# Patient Record
Sex: Female | Born: 1975 | Race: White | Hispanic: No | Marital: Married | Smoking: Never smoker
Health system: Southern US, Community
[De-identification: ages and names within clinical notes are randomized; demographics above are authoritative.]

## PROBLEM LIST (undated history)

## (undated) ENCOUNTER — Inpatient Hospital Stay (HOSPITAL_COMMUNITY): Payer: Self-pay

## (undated) DIAGNOSIS — R55 Syncope and collapse: Secondary | ICD-10-CM

## (undated) DIAGNOSIS — R51 Headache: Secondary | ICD-10-CM

## (undated) DIAGNOSIS — I499 Cardiac arrhythmia, unspecified: Secondary | ICD-10-CM

## (undated) DIAGNOSIS — R000 Tachycardia, unspecified: Secondary | ICD-10-CM

## (undated) DIAGNOSIS — D219 Benign neoplasm of connective and other soft tissue, unspecified: Secondary | ICD-10-CM

## (undated) DIAGNOSIS — K219 Gastro-esophageal reflux disease without esophagitis: Secondary | ICD-10-CM

## (undated) DIAGNOSIS — D649 Anemia, unspecified: Secondary | ICD-10-CM

## (undated) HISTORY — PX: BREAST SURGERY: SHX581

---

## 1999-07-14 ENCOUNTER — Other Ambulatory Visit: Admission: RE | Admit: 1999-07-14 | Discharge: 1999-07-14 | Payer: Self-pay | Admitting: Obstetrics and Gynecology

## 1999-11-10 ENCOUNTER — Encounter: Payer: Self-pay | Admitting: Obstetrics and Gynecology

## 1999-11-10 ENCOUNTER — Ambulatory Visit (HOSPITAL_COMMUNITY): Admission: RE | Admit: 1999-11-10 | Discharge: 1999-11-10 | Payer: Self-pay | Admitting: Obstetrics and Gynecology

## 2000-08-09 ENCOUNTER — Other Ambulatory Visit: Admission: RE | Admit: 2000-08-09 | Discharge: 2000-08-09 | Payer: Self-pay | Admitting: Obstetrics and Gynecology

## 2001-06-27 ENCOUNTER — Ambulatory Visit (HOSPITAL_COMMUNITY): Admission: RE | Admit: 2001-06-27 | Discharge: 2001-06-27 | Payer: Self-pay | Admitting: Internal Medicine

## 2001-11-06 ENCOUNTER — Other Ambulatory Visit: Admission: RE | Admit: 2001-11-06 | Discharge: 2001-11-06 | Payer: Self-pay | Admitting: Obstetrics and Gynecology

## 2003-02-05 ENCOUNTER — Other Ambulatory Visit: Admission: RE | Admit: 2003-02-05 | Discharge: 2003-02-05 | Payer: Self-pay | Admitting: Obstetrics and Gynecology

## 2003-03-29 ENCOUNTER — Encounter: Admission: RE | Admit: 2003-03-29 | Discharge: 2003-06-27 | Payer: Self-pay | Admitting: *Deleted

## 2004-06-02 ENCOUNTER — Other Ambulatory Visit: Admission: RE | Admit: 2004-06-02 | Discharge: 2004-06-02 | Payer: Self-pay | Admitting: Obstetrics and Gynecology

## 2005-06-25 HISTORY — PX: BREAST ENHANCEMENT SURGERY: SHX7

## 2005-09-14 ENCOUNTER — Other Ambulatory Visit: Admission: RE | Admit: 2005-09-14 | Discharge: 2005-09-14 | Payer: Self-pay | Admitting: Obstetrics and Gynecology

## 2007-04-24 ENCOUNTER — Ambulatory Visit: Payer: Self-pay | Admitting: Family Medicine

## 2010-11-21 ENCOUNTER — Inpatient Hospital Stay (HOSPITAL_COMMUNITY): Payer: BC Managed Care – PPO

## 2010-11-21 ENCOUNTER — Inpatient Hospital Stay (HOSPITAL_COMMUNITY)
Admission: AD | Admit: 2010-11-21 | Discharge: 2010-12-07 | DRG: 651 | Disposition: A | Discharge status: Home / Self Care | Payer: BC Managed Care – PPO | Source: Ambulatory Visit | Attending: Obstetrics and Gynecology | Admitting: Obstetrics and Gynecology

## 2010-11-21 DIAGNOSIS — D259 Leiomyoma of uterus, unspecified: Secondary | ICD-10-CM | POA: Diagnosis present

## 2010-11-21 DIAGNOSIS — Z22330 Carrier of Group B streptococcus: Secondary | ICD-10-CM

## 2010-11-21 DIAGNOSIS — O321XX0 Maternal care for breech presentation, not applicable or unspecified: Secondary | ICD-10-CM | POA: Diagnosis present

## 2010-11-21 DIAGNOSIS — O34599 Maternal care for other abnormalities of gravid uterus, unspecified trimester: Secondary | ICD-10-CM | POA: Diagnosis present

## 2010-11-21 DIAGNOSIS — D4959 Neoplasm of unspecified behavior of other genitourinary organ: Secondary | ICD-10-CM | POA: Diagnosis present

## 2010-11-21 DIAGNOSIS — O99892 Other specified diseases and conditions complicating childbirth: Secondary | ICD-10-CM | POA: Diagnosis present

## 2010-11-21 DIAGNOSIS — O42919 Preterm premature rupture of membranes, unspecified as to length of time between rupture and onset of labor, unspecified trimester: Secondary | ICD-10-CM

## 2010-11-21 DIAGNOSIS — O4290 Premature rupture of membranes, unspecified as to length of time between rupture and onset of labor, unspecified weeks of gestation: Principal | ICD-10-CM

## 2010-11-21 LAB — CBC
HCT: 34.1 % — ABNORMAL LOW (ref 36.0–46.0)
Hemoglobin: 11.5 g/dL — ABNORMAL LOW (ref 12.0–15.0)
MCH: 31.2 pg (ref 26.0–34.0)
MCHC: 33.7 g/dL (ref 30.0–36.0)
MCV: 92.4 fL (ref 78.0–100.0)
Platelets: 260 10*3/uL (ref 150–400)
RBC: 3.69 MIL/uL — ABNORMAL LOW (ref 3.87–5.11)
RDW: 12.7 % (ref 11.5–15.5)
WBC: 12.7 10*3/uL — ABNORMAL HIGH (ref 4.0–10.5)

## 2010-11-21 LAB — COMPREHENSIVE METABOLIC PANEL
ALT: 16 U/L (ref 0–35)
Albumin: 2.5 g/dL — ABNORMAL LOW (ref 3.5–5.2)
Alkaline Phosphatase: 100 U/L (ref 39–117)
Calcium: 9 mg/dL (ref 8.4–10.5)
Potassium: 4.1 mEq/L (ref 3.5–5.1)
Sodium: 137 mEq/L (ref 135–145)
Total Protein: 5.9 g/dL — ABNORMAL LOW (ref 6.0–8.3)

## 2010-11-22 LAB — RH IMMUNE GLOBULIN WORKUP (NOT WOMEN'S HOSP)
ABO/RH(D): A NEG
Antibody Screen: NEGATIVE
Unit division: 0

## 2010-11-23 LAB — STREP B DNA PROBE

## 2010-11-23 LAB — GENTAMICIN LEVEL, PEAK: Gentamicin Pk: 3.4 ug/mL — ABNORMAL LOW (ref 5.0–10.0)

## 2010-11-24 LAB — CBC
HCT: 31 % — ABNORMAL LOW (ref 36.0–46.0)
Hemoglobin: 10.5 g/dL — ABNORMAL LOW (ref 12.0–15.0)
MCHC: 33.9 g/dL (ref 30.0–36.0)
RDW: 13.2 % (ref 11.5–15.5)
WBC: 13.5 10*3/uL — ABNORMAL HIGH (ref 4.0–10.5)

## 2010-11-27 ENCOUNTER — Inpatient Hospital Stay (HOSPITAL_COMMUNITY): Payer: BC Managed Care – PPO

## 2010-11-27 LAB — CBC
Hemoglobin: 12 g/dL (ref 12.0–15.0)
MCH: 31.9 pg (ref 26.0–34.0)
Platelets: 287 10*3/uL (ref 150–400)
RBC: 3.76 MIL/uL — ABNORMAL LOW (ref 3.87–5.11)
WBC: 16.5 10*3/uL — ABNORMAL HIGH (ref 4.0–10.5)

## 2010-11-28 ENCOUNTER — Other Ambulatory Visit (HOSPITAL_COMMUNITY): Payer: BC Managed Care – PPO

## 2010-11-28 ENCOUNTER — Inpatient Hospital Stay (HOSPITAL_COMMUNITY): Payer: BC Managed Care – PPO

## 2010-11-28 LAB — URINE CULTURE

## 2010-11-29 LAB — CULTURE, BETA STREP (GROUP B ONLY)

## 2010-11-30 ENCOUNTER — Inpatient Hospital Stay (HOSPITAL_COMMUNITY): Payer: BC Managed Care – PPO

## 2010-11-30 LAB — CBC
HCT: 33.7 % — ABNORMAL LOW (ref 36.0–46.0)
Hemoglobin: 11.2 g/dL — ABNORMAL LOW (ref 12.0–15.0)
MCH: 31.4 pg (ref 26.0–34.0)
MCV: 94.4 fL (ref 78.0–100.0)
Platelets: 288 10*3/uL (ref 150–400)
RBC: 3.57 MIL/uL — ABNORMAL LOW (ref 3.87–5.11)
WBC: 14.8 10*3/uL — ABNORMAL HIGH (ref 4.0–10.5)

## 2010-12-01 LAB — CBC
HCT: 35.3 % — ABNORMAL LOW (ref 36.0–46.0)
MCH: 31.6 pg (ref 26.0–34.0)
MCV: 93.9 fL (ref 78.0–100.0)
RDW: 13.1 % (ref 11.5–15.5)
WBC: 16.3 10*3/uL — ABNORMAL HIGH (ref 4.0–10.5)

## 2010-12-01 LAB — DIFFERENTIAL
Eosinophils Absolute: 0.1 10*3/uL (ref 0.0–0.7)
Eosinophils Relative: 1 % (ref 0–5)
Lymphocytes Relative: 9 % — ABNORMAL LOW (ref 12–46)
Lymphs Abs: 1.5 10*3/uL (ref 0.7–4.0)
Monocytes Absolute: 1.1 10*3/uL — ABNORMAL HIGH (ref 0.1–1.0)
Monocytes Relative: 7 % (ref 3–12)

## 2010-12-03 ENCOUNTER — Other Ambulatory Visit: Payer: Self-pay | Admitting: Obstetrics and Gynecology

## 2010-12-03 LAB — CBC
Hemoglobin: 11 g/dL — ABNORMAL LOW (ref 12.0–15.0)
MCH: 32.2 pg (ref 26.0–34.0)
MCHC: 34.5 g/dL (ref 30.0–36.0)
RDW: 13.1 % (ref 11.5–15.5)

## 2010-12-04 ENCOUNTER — Other Ambulatory Visit (HOSPITAL_COMMUNITY): Payer: BC Managed Care – PPO

## 2010-12-04 LAB — CBC
MCH: 31.6 pg (ref 26.0–34.0)
MCV: 93.7 fL (ref 78.0–100.0)
Platelets: 225 10*3/uL (ref 150–400)
RDW: 12.8 % (ref 11.5–15.5)
WBC: 15.1 10*3/uL — ABNORMAL HIGH (ref 4.0–10.5)

## 2010-12-06 LAB — CULTURE, BETA STREP (GROUP B ONLY)

## 2010-12-07 ENCOUNTER — Encounter (HOSPITAL_COMMUNITY)
Admission: RE | Admit: 2010-12-07 | Discharge: 2010-12-07 | Disposition: A | Discharge status: Home / Self Care | Payer: BC Managed Care – PPO | Source: Ambulatory Visit | Attending: Obstetrics and Gynecology | Admitting: Obstetrics and Gynecology

## 2010-12-07 ENCOUNTER — Other Ambulatory Visit (HOSPITAL_COMMUNITY): Payer: BC Managed Care – PPO

## 2010-12-07 DIAGNOSIS — O923 Agalactia: Secondary | ICD-10-CM | POA: Insufficient documentation

## 2010-12-07 LAB — RH IMMUNE GLOB WKUP(>/=20WKS)(NOT WOMEN'S HOSP): Fetal Screen: NEGATIVE

## 2010-12-22 NOTE — Op Note (Signed)
[name], [name]               ACCOUNT NO.:  [id number]  MEDICAL RECORD NO.:  [id number]  LOCATION:  9131                          FACILITY:  [facility]  PHYSICIAN:  Guy Sandifer. Henderson Cloud, M.D. DATE OF BIRTH:  [birth year]  DATE OF PROCEDURE:  12/03/2010 DATE OF DISCHARGE:                              OPERATIVE REPORT   PREOPERATIVE DIAGNOSES: 1. Intrauterine pregnancy at 29-5/7 weeks estimated gestational age. 2. Breech. 3. Premature rupture of membranes. 4. Labor.  POSTOPERATIVE DIAGNOSES: 1. Intrauterine pregnancy at 29-5/7 weeks estimated gestational age. 2. Breech. 3. Premature rupture of membranes. 4. Labor. 5. Uterine leiomyomata.  PROCEDURE:  Emergent cesarean section with an inverted T-incision on the uterus and uterine myomectomy.  SURGEON:  Guy Sandifer. Henderson Cloud, MD  ANESTHESIA:  Spinal, Dana Allan, MD  SPECIMENS: 1. Placenta. 2. Uterine leiomyomata to pathology.  FINDINGS:  Viable female infant, Apgars of 5 and seven at 1 and five minutes respectively.  Birth weight 3 pounds 3 ounces.  Arterial cord pH insufficient.  SPECIMEN:  Estimated blood loss 1000 mL.  INDICATIONS AND CONSENT:  This patient is a 35 year old married white female G1 P0 who is admitted on Nov 21, 2010, with PROM.  The baby is in the breech position.  She has uterine leiomyomata.  She receives IV antibiotics and steroids.  She continues to leak fluid throughout the course of her stay.  Magnesium sulfate is started and discontinued at 1.4 neuroprophylaxis.  She remains afebrile with a nontender uterus. She begins contracting again in the early a.m. of December 01, 2010.  She started back on magnesium sulfate.  She continues on intravenous clindamycin and oral Zithromax.  This evening, she was having 3-4 contractions an hour.  However, she complains of rectal pressure with the contractions.  She has some pink staining with her fluid as well. Examination reveals the breech to be in the upper part of the  vagina. The emergent cesarean section was recommended.  Potential risks and complications were discussed with the patient and her husband including, but not limited to infection, organ damage, bleeding requiring transfusion of blood products with HIV and hepatitis acquisition, DVT, PE and pneumonia.  All questions were answered.  PROCEDURE:  The patient was taken to the operating room where she was identified as a spinal anesthetic placed.  She was placed in the dorsal supine position with a 15 degrees left lateral wedge.  Time-out was undertaken.  She was prepped abdominally.  Foley catheter was placed in the bladder to drain and she was draped in the sterile fashion.  After testing for adequate spinal anesthesia, skin was entered through a Pfannenstiel incision and dissection was carried out in layers to the peritoneum.  Peritoneum was incised and extended superiorly and inferiorly.  There are multiple leiomyomata ranging from 1-3-4 cm across the anterior uterine fundus extending down toward the lower uterine segment.  Vesicouterine peritoneum was taken down cephalolaterally.  The bladder flap was developed and the bladder blade was placed.  The uterus was incised in a low transverse manner, that was clear of the leiomyomata.  The uterus was entered bluntly with a hemostat and the uterine incision was extended cephalolaterally.  The baby is  in the frank breech position with the feet in the incision.  With the nurse's hand per vagina to elevate the breech, it allows me to elevate the breech adequately to deliver the legs in the breech through the incision.  The arms were then delivered as the baby's body was controlled.  The uterine fundus clamps down on the baby's head. Therefore, the incision was ted in the center of the lower uterine segment with bandage scissors.  My fingers were kept between the scissors and the baby's face.  This was done basically through and around uterine  leiomyomata.  This allows easy delivery of the baby's head.  Cord was clamped and cut and the baby was handed to the waiting pediatrics team.  Placenta was manually delivered and sent to pathology. Uterus was delivered through the incision for better exposure.  The vertical portion of the incision and the lower uterine segment extending up to the bottom of the anterior wall of the uterus was closed in 3 running locking layers of 0 Monocryl suture which achieves good hemostasis.  The low transverse incision had extended completely through the sides of the lower uterine segment.  With the uterus delivered through the incision, this allowed the operator's hand to be placed behind the lower uterine segment to protect the viscera while the incision was closed with 2 running imbricating layers of 0 Monocryl suture.  Two additional figure-of-eights were required on the left apex of the incision to obtain complete hemostasis.  Irrigation was carried out.  Inspection reveals the tubes and ovaries to be normal bilaterally. There are multiple larger leiomyomata about 4-cm in size at the uterine fundus some of which are pedunculated or subserosal.  It should also be noted that while closing the vertical part of the ted incision leiomyomata ranging from 1-2 cm were removed to allow proper closure. The uterus was then returned to the abdomen.  Irrigation reveals minor bleeding at the left apex which was controlled with an additional figure- of-eight.  Again, close inspection reveals no damage to surrounding viscera with this suture.  The anterior peritoneum was then closed in running fashion with 0 Monocryl suture which was also used to reapproximate the pyramidalis muscle in the midline.  Anterior rectus fascia was closed in running fashion with a 0 looped PDS suture and the skin was closed with clips.  All sponge, instrument and needle counts were correct and the patient was transferred to the  recovery room in stable condition.     Guy Sandifer Henderson Cloud, M.D.     JET/MEDQ  D:  12/03/2010  T:  12/04/2010  Job:  [id number]  Electronically Signed by Harold Hedge M.D. on 12/22/2010 09:15:31 AM

## 2010-12-22 NOTE — Discharge Summary (Signed)
[name], [name]               ACCOUNT NO.:  [id number]  MEDICAL RECORD NO.:  [id number]  LOCATION:  9131                          FACILITY:  [facility]  PHYSICIAN:  Guy Sandifer. Henderson Cloud, M.D. DATE OF BIRTH:  1976/05/09  DATE OF ADMISSION:  11/17/2010 DATE OF DISCHARGE:  12/07/2010                              DISCHARGE SUMMARY   ADMITTING DIAGNOSES: 1. Intrauterine pregnancy at 28-1/7th weeks' estimated gestational     age. 2. Preterm premature rupture of membranes.  DISCHARGE DIAGNOSES: 1. Status post low transverse cesarean section. 2. Viable female infant. 3. Myomectomy PROCEDURES: 1. Emergency cesarean section with an inverted-T incision  2. Uterine myomectomy.   REASON FOR ADMISSION:  Please see written H and P.  HOSPITAL COURSE:  The patient is a 35 year old primigravida who was admitted to [facility] at 28-1/7th weeks' estimated gestational age with preterm premature rupture of membranes.  On admission, vital signs were stable.  Fetal heart tones were reassuring. Clear fluid was noted.  The patient did have known uterine fibroids. The patient was started on steroids, clindamycin, and gentamycin. Ultrasound was performed for estimated fetal weight, presentation, and cervical length.  NICU consult was made.  The patient was started on magnesium sulfate.  Ultrasound was done which revealed biophysical profile of 4/8, two off for fetal movement and two off for fluid.  Baby was known to be in the breech presentation.  Group B beta strep culture was performed.  The patient continued to have small amount of leakage of amniotic fluid. Vital signs remained stable.  She was afebrile.  Fetus was reactive. Gentamyacin was discontinued.  The patient was started on erythromycin. Over the course of the next several days, the patient did develop a rash that was thought to be related to the azithromycin.  On December 03, 2010, the patient did complain of some rectal pressure  associated with the contractions.  Fetal heart tones were reactive and contractions were noted to be 3-4 per hour.  Baby was in the breech presentation, noted to be in the upper vagina.  Decision was made to proceed with an emergent cesarean delivery.  The patient was then taken to the operating room where spinal anesthesia was administered without difficulty.  A low transverse incision was made with an inverted-T of a viable female infant with Apgars of 5 at 1 minute and 7 at 5 minutes.  Birth weight was 3 pounds and 3 ounces.  The patient was then taken to the recovery room in stable condition.  On postoperative day #1, the patient did report the rash was worsening.  Baby was stable in the NICU on oxygen. Vital signs were stable.  Temp was 99.5.  Abdomen soft.  Erythematous rash was noted on the trunk, neck, and extremities.  Fundus was firm and nontender.  Abdominal dressing was noted to be clean, dry, and intact. Foley was drained with concentrated urine.  Laboratory findings revealed hemoglobin of 10.0, platelet count of 225,000, blood type was noted to be A negative.  The patient was started on IV steroids  and administered Benadryl p.o.  On postoperative day #2, the patient was without complaint.  Vital signs stable.  Fundus  firm and nontender. Incision was clean, dry, and intact.  On postoperative day #3, baby continued in the NICU on nasal O2 and feeding tube.  Itching was improved and she continued with a rash.  Vital signs stable.  Abdomen soft.  Fundus firm and nontender.  On postoperative day #4, the patient was without complaint.  Baby was in the NICU, now off the oxygen and continued on NG feeding tube.  Vital signs were stable.  Incision was clean, dry, and intact.  Staples were removed.  The patient later discharged home.  CONDITION ON DISCHARGE:  Stable.  DIET:  Regular as tolerated.  ACTIVITY:  No heavy lifting, no driving x2 weeks, no vaginal entry.  FOLLOWUP:   The patient is to follow up in the office in 1-2 weeks for an incision check.  She is to call for temperature greater than 100 degrees, persistent nausea, vomiting, heavy vaginal bleeding and/or redness or drainage from incisional site.  DISCHARGE MEDICATIONS: 1. Percocet 5/325, #30, 1 p.o. every 4-6 hours p.r.n. 2. Motrin 600 mg every 6 hours. 3. Prenatal vitamins 1 p.o. daily.     Julio Sicks, N.P.   ______________________________ Guy Sandifer. Henderson Cloud, M.D.    CC/MEDQ  D:  12/07/2010  T:  12/07/2010  Job:  [id number]  Electronically Signed by Julio Sicks N.P. on 12/11/2010 08:24:45 AM Electronically Signed by Harold Hedge M.D. on 12/22/2010 09:16:23 AM

## 2010-12-24 ENCOUNTER — Inpatient Hospital Stay (HOSPITAL_COMMUNITY): Admission: AD | Admit: 2010-12-24 | Payer: Self-pay | Source: Ambulatory Visit | Admitting: Obstetrics and Gynecology

## 2010-12-24 ENCOUNTER — Inpatient Hospital Stay: Admission: AD | Admit: 2010-12-24 | Payer: Self-pay | Source: Ambulatory Visit | Admitting: Obstetrics and Gynecology

## 2011-01-07 ENCOUNTER — Encounter (HOSPITAL_COMMUNITY)
Admission: RE | Admit: 2011-01-07 | Discharge: 2011-01-07 | Disposition: A | Discharge status: Home / Self Care | Payer: BC Managed Care – PPO | Source: Ambulatory Visit | Attending: Obstetrics and Gynecology | Admitting: Obstetrics and Gynecology

## 2011-01-07 DIAGNOSIS — O923 Agalactia: Secondary | ICD-10-CM | POA: Insufficient documentation

## 2011-02-07 ENCOUNTER — Encounter (HOSPITAL_COMMUNITY)
Admission: RE | Admit: 2011-02-07 | Discharge: 2011-02-07 | Disposition: A | Discharge status: Home / Self Care | Payer: BC Managed Care – PPO | Source: Ambulatory Visit | Attending: Obstetrics and Gynecology | Admitting: Obstetrics and Gynecology

## 2011-02-07 DIAGNOSIS — O923 Agalactia: Secondary | ICD-10-CM | POA: Insufficient documentation

## 2011-03-10 ENCOUNTER — Encounter (HOSPITAL_COMMUNITY)
Admission: RE | Admit: 2011-03-10 | Discharge: 2011-03-10 | Disposition: A | Discharge status: Home / Self Care | Payer: BC Managed Care – PPO | Source: Ambulatory Visit | Attending: Obstetrics and Gynecology | Admitting: Obstetrics and Gynecology

## 2011-03-10 DIAGNOSIS — O923 Agalactia: Secondary | ICD-10-CM | POA: Insufficient documentation

## 2011-04-10 ENCOUNTER — Encounter (HOSPITAL_COMMUNITY)
Admission: RE | Admit: 2011-04-10 | Discharge: 2011-04-10 | Disposition: A | Discharge status: Home / Self Care | Payer: BC Managed Care – PPO | Source: Ambulatory Visit | Attending: Obstetrics and Gynecology | Admitting: Obstetrics and Gynecology

## 2011-04-10 DIAGNOSIS — O923 Agalactia: Secondary | ICD-10-CM | POA: Insufficient documentation

## 2011-05-04 ENCOUNTER — Other Ambulatory Visit: Payer: Self-pay | Admitting: Dermatology

## 2011-05-11 ENCOUNTER — Encounter (HOSPITAL_COMMUNITY)
Admission: RE | Admit: 2011-05-11 | Discharge: 2011-05-11 | Disposition: A | Discharge status: Home / Self Care | Payer: BC Managed Care – PPO | Source: Ambulatory Visit | Attending: Obstetrics and Gynecology | Admitting: Obstetrics and Gynecology

## 2011-05-11 DIAGNOSIS — O923 Agalactia: Secondary | ICD-10-CM | POA: Insufficient documentation

## 2011-07-23 ENCOUNTER — Other Ambulatory Visit (HOSPITAL_COMMUNITY): Payer: BC Managed Care – PPO

## 2011-08-27 ENCOUNTER — Encounter (HOSPITAL_COMMUNITY): Payer: Self-pay | Admitting: Pharmacist

## 2011-09-10 ENCOUNTER — Encounter (HOSPITAL_COMMUNITY): Payer: Self-pay

## 2011-09-10 ENCOUNTER — Encounter (HOSPITAL_COMMUNITY)
Admission: RE | Admit: 2011-09-10 | Discharge: 2011-09-10 | Disposition: A | Discharge status: Home / Self Care | Payer: BC Managed Care – PPO | Source: Ambulatory Visit | Attending: Obstetrics and Gynecology | Admitting: Obstetrics and Gynecology

## 2011-09-10 HISTORY — DX: Syncope and collapse: R55

## 2011-09-10 HISTORY — DX: Gastro-esophageal reflux disease without esophagitis: K21.9

## 2011-09-10 HISTORY — DX: Headache: R51

## 2011-09-10 LAB — CBC
HCT: 41.6 % (ref 36.0–46.0)
Hemoglobin: 13.9 g/dL (ref 12.0–15.0)
MCH: 30.9 pg (ref 26.0–34.0)
MCHC: 33.4 g/dL (ref 30.0–36.0)
RBC: 4.5 MIL/uL (ref 3.87–5.11)

## 2011-09-10 LAB — SURGICAL PCR SCREEN
MRSA, PCR: NEGATIVE
Staphylococcus aureus: NEGATIVE

## 2011-09-10 NOTE — Patient Instructions (Addendum)
   Your procedure is scheduled on: Thursday March 21st  Enter through the Main Entrance of [facility] at: [employer] up the phone at the desk and dial [phone] and inform us of your arrival.  Please call this number if you have any problems the morning of surgery: [phone]  Remember: Do not eat food after midnight: Wednesday Do not drink clear liquids after: midnight Wednesday Take these medicines the morning of surgery with a SIP OF WATER: zantac Do not wear jewelry, make-up, or FINGER nail polish Do not wear lotions, powders, perfumes or deodorant. Do not shave 48 hours prior to surgery. Do not bring valuables to the hospital.  Leave suitcase in the car. After Surgery it may be brought to your room. For patients being admitted to the hospital, checkout time is 11:00am the day of discharge.  Patients discharged on the day of surgery will not be allowed to drive home.     Remember to use your hibiclens as instructed.Please shower with 1/2 bottle the evening before your surgery and the other 1/2 bottle the morning of surgery. Neck down avoiding private area.

## 2011-09-11 NOTE — H&P (Signed)
36 year old G 1 P 1 with symptomatic fibroids. She had PPROM and early delivery and the PPROM was thought to be secondary to the fibroids. She wants the fibroids removed before she conceives again. She does complain of a lot of lower abdominal cramping.  Ultrasound in October, 2012 revealed multiple intramural fibroids - largest measured 3.1 cm in diameter. She presents for abdominal myomectomies  Med History  Headaches  Surgical history  Cesarean Section Breast Agumentation  Medications None  Allergies PCN KEFLEX BACTRIM Azithromax  ROS Negative Social history  No tobacco no ETOH  Family History  Headaches Heart disease  Afebrile Vital Signs stable General alert and oriented Lung CTAB Car RRR Abdomen  Previous incision inspected - there is an indentation above the scar  Pelvic Uterus 10 - 12 weeks fibroids  IMPRESSION: Symptomatic Fibroids  PLAN: Abdominal Myomectomies Risks are reviewed with the patient

## 2011-09-13 ENCOUNTER — Inpatient Hospital Stay (HOSPITAL_COMMUNITY): Payer: BC Managed Care – PPO | Admitting: Anesthesiology

## 2011-09-13 ENCOUNTER — Encounter (HOSPITAL_COMMUNITY)
Admission: RE | Disposition: A | Discharge status: Home / Self Care | Payer: Self-pay | Source: Ambulatory Visit | Attending: Obstetrics and Gynecology

## 2011-09-13 ENCOUNTER — Inpatient Hospital Stay (HOSPITAL_COMMUNITY)
Admission: RE | Admit: 2011-09-13 | Discharge: 2011-09-15 | DRG: 359 | Disposition: A | Discharge status: Home / Self Care | Payer: BC Managed Care – PPO | Source: Ambulatory Visit | Attending: Obstetrics and Gynecology | Admitting: Obstetrics and Gynecology

## 2011-09-13 ENCOUNTER — Encounter (HOSPITAL_COMMUNITY): Payer: Self-pay | Admitting: Anesthesiology

## 2011-09-13 ENCOUNTER — Encounter (HOSPITAL_COMMUNITY): Payer: Self-pay | Admitting: *Deleted

## 2011-09-13 DIAGNOSIS — D251 Intramural leiomyoma of uterus: Principal | ICD-10-CM | POA: Diagnosis present

## 2011-09-13 DIAGNOSIS — Z01818 Encounter for other preprocedural examination: Secondary | ICD-10-CM

## 2011-09-13 DIAGNOSIS — D219 Benign neoplasm of connective and other soft tissue, unspecified: Secondary | ICD-10-CM

## 2011-09-13 DIAGNOSIS — Z01812 Encounter for preprocedural laboratory examination: Secondary | ICD-10-CM

## 2011-09-13 HISTORY — PX: MYOMECTOMY: SHX85

## 2011-09-13 LAB — CBC
Hemoglobin: 10.1 g/dL — ABNORMAL LOW (ref 12.0–15.0)
Platelets: 189 10*3/uL (ref 150–400)
RBC: 3.25 MIL/uL — ABNORMAL LOW (ref 3.87–5.11)

## 2011-09-13 SURGERY — MYOMECTOMY, ABDOMINAL APPROACH
Anesthesia: General | Wound class: Clean Contaminated

## 2011-09-13 MED ORDER — LACTATED RINGERS IV SOLN
INTRAVENOUS | Status: DC
  Administered 2011-09-13 (×3): via INTRAVENOUS

## 2011-09-13 MED ORDER — PROPOFOL 10 MG/ML IV EMUL
INTRAVENOUS | Status: DC | PRN
  Administered 2011-09-13: 30 ug/kg/min via INTRAVENOUS

## 2011-09-13 MED ORDER — FENTANYL CITRATE 0.05 MG/ML IJ SOLN
INTRAMUSCULAR | Status: AC
  Filled 2011-09-13: qty 2

## 2011-09-13 MED ORDER — DIPHENHYDRAMINE HCL 50 MG/ML IJ SOLN: 25.0000 mg | INTRAMUSCULAR | Status: DC | PRN

## 2011-09-13 MED ORDER — METOCLOPRAMIDE HCL 5 MG/ML IJ SOLN: 10.0000 mg | Freq: Three times a day (TID) | INTRAMUSCULAR | Status: DC | PRN

## 2011-09-13 MED ORDER — MENTHOL 3 MG MT LOZG: 1.0000 | LOZENGE | OROMUCOSAL | Status: DC | PRN

## 2011-09-13 MED ORDER — FENTANYL CITRATE 0.05 MG/ML IJ SOLN
INTRAMUSCULAR | Status: AC
  Filled 2011-09-13: qty 5

## 2011-09-13 MED ORDER — MORPHINE SULFATE 0.5 MG/ML IJ SOLN
INTRAMUSCULAR | Status: AC
  Filled 2011-09-13: qty 10

## 2011-09-13 MED ORDER — 0.9 % SODIUM CHLORIDE (POUR BTL) OPTIME
TOPICAL | Status: DC | PRN
  Administered 2011-09-13: 1000 mL

## 2011-09-13 MED ORDER — FENTANYL CITRATE 0.05 MG/ML IJ SOLN: 25.0000 ug | INTRAMUSCULAR | Status: DC | PRN

## 2011-09-13 MED ORDER — MORPHINE SULFATE (PF) 0.5 MG/ML IJ SOLN
INTRAMUSCULAR | Status: DC | PRN
  Administered 2011-09-13: .1 mg via INTRATHECAL

## 2011-09-13 MED ORDER — LACTATED RINGERS IV BOLUS (SEPSIS)
1000.0000 mL | Freq: Once | INTRAVENOUS | Status: AC
  Administered 2011-09-13: 1000 mL via INTRAVENOUS

## 2011-09-13 MED ORDER — MEPERIDINE HCL 25 MG/ML IJ SOLN
INTRAMUSCULAR | Status: AC
  Filled 2011-09-13: qty 1

## 2011-09-13 MED ORDER — BUPIVACAINE IN DEXTROSE 0.75-8.25 % IT SOLN
INTRATHECAL | Status: DC | PRN
  Administered 2011-09-13: 1.5 mL via INTRATHECAL

## 2011-09-13 MED ORDER — MIDAZOLAM HCL 5 MG/5ML IJ SOLN
INTRAMUSCULAR | Status: DC | PRN
  Administered 2011-09-13: 2 mg via INTRAVENOUS

## 2011-09-13 MED ORDER — MIDAZOLAM HCL 2 MG/2ML IJ SOLN
INTRAMUSCULAR | Status: AC
  Filled 2011-09-13: qty 2

## 2011-09-13 MED ORDER — ONDANSETRON HCL 4 MG/2ML IJ SOLN
INTRAMUSCULAR | Status: DC | PRN
  Administered 2011-09-13: 4 mg via INTRAVENOUS

## 2011-09-13 MED ORDER — KETOROLAC TROMETHAMINE 30 MG/ML IJ SOLN
30.0000 mg | Freq: Four times a day (QID) | INTRAMUSCULAR | Status: AC | PRN
  Administered 2011-09-13: 30 mg via INTRAVENOUS
  Filled 2011-09-13: qty 1

## 2011-09-13 MED ORDER — NALBUPHINE HCL 10 MG/ML IJ SOLN
5.0000 mg | INTRAMUSCULAR | Status: DC | PRN
  Filled 2011-09-13: qty 1

## 2011-09-13 MED ORDER — DEXTROSE 5 % IV SOLN
INTRAVENOUS | Status: AC
  Administered 2011-09-13: 100 mL via INTRAVENOUS
  Filled 2011-09-13: qty 2.5

## 2011-09-13 MED ORDER — LIDOCAINE HCL (CARDIAC) 20 MG/ML IV SOLN
INTRAVENOUS | Status: DC | PRN
  Administered 2011-09-13 (×3): 20 mg via INTRAVENOUS

## 2011-09-13 MED ORDER — ONDANSETRON HCL 4 MG/2ML IJ SOLN
INTRAMUSCULAR | Status: AC
  Filled 2011-09-13: qty 2

## 2011-09-13 MED ORDER — FENTANYL CITRATE 0.05 MG/ML IJ SOLN
INTRAMUSCULAR | Status: DC | PRN
  Administered 2011-09-13: 20 ug via INTRAVENOUS
  Administered 2011-09-13: 15 ug via INTRAVENOUS
  Administered 2011-09-13 (×2): 25 ug via INTRAVENOUS

## 2011-09-13 MED ORDER — DEXAMETHASONE SODIUM PHOSPHATE 10 MG/ML IJ SOLN
INTRAMUSCULAR | Status: AC
  Filled 2011-09-13: qty 1

## 2011-09-13 MED ORDER — GLYCOPYRROLATE 0.2 MG/ML IJ SOLN
INTRAMUSCULAR | Status: DC | PRN
  Administered 2011-09-13: 0.2 mg via INTRAVENOUS

## 2011-09-13 MED ORDER — GLYCOPYRROLATE 0.2 MG/ML IJ SOLN
INTRAMUSCULAR | Status: AC
  Filled 2011-09-13: qty 1

## 2011-09-13 MED ORDER — SODIUM CHLORIDE 0.9 % IV SOLN: 1.0000 ug/kg/h | INTRAVENOUS | Status: DC | PRN

## 2011-09-13 MED ORDER — DIPHENHYDRAMINE HCL 50 MG/ML IJ SOLN: 12.5000 mg | INTRAMUSCULAR | Status: DC | PRN

## 2011-09-13 MED ORDER — NEOSTIGMINE METHYLSULFATE 1 MG/ML IJ SOLN
INTRAMUSCULAR | Status: AC
  Filled 2011-09-13: qty 10

## 2011-09-13 MED ORDER — ONDANSETRON HCL 4 MG/2ML IJ SOLN: 4.0000 mg | Freq: Three times a day (TID) | INTRAMUSCULAR | Status: DC | PRN

## 2011-09-13 MED ORDER — SIMETHICONE 80 MG PO CHEW
80.0000 mg | CHEWABLE_TABLET | Freq: Four times a day (QID) | ORAL | Status: DC | PRN
Start: 2011-09-13 — End: 2011-09-15
  Administered 2011-09-13 – 2011-09-15 (×6): 80 mg via ORAL

## 2011-09-13 MED ORDER — DEXTROSE IN LACTATED RINGERS 5 % IV SOLN
INTRAVENOUS | Status: DC
  Administered 2011-09-13 – 2011-09-14 (×2): via INTRAVENOUS

## 2011-09-13 MED ORDER — DIPHENHYDRAMINE HCL 25 MG PO CAPS: 25.0000 mg | ORAL_CAPSULE | ORAL | Status: DC | PRN

## 2011-09-13 MED ORDER — FAMOTIDINE 20 MG PO TABS
20.0000 mg | ORAL_TABLET | Freq: Two times a day (BID) | ORAL | Status: DC
  Administered 2011-09-13 – 2011-09-15 (×5): 20 mg via ORAL
  Filled 2011-09-13 (×5): qty 1

## 2011-09-13 MED ORDER — DEXAMETHASONE SODIUM PHOSPHATE 10 MG/ML IJ SOLN
INTRAMUSCULAR | Status: DC | PRN
  Administered 2011-09-13: 10 mg via INTRAVENOUS

## 2011-09-13 MED ORDER — KETOROLAC TROMETHAMINE 60 MG/2ML IM SOLN
60.0000 mg | Freq: Once | INTRAMUSCULAR | Status: AC | PRN
  Filled 2011-09-13: qty 2

## 2011-09-13 MED ORDER — SCOPOLAMINE 1 MG/3DAYS TD PT72
1.0000 | MEDICATED_PATCH | Freq: Once | TRANSDERMAL | Status: DC
  Administered 2011-09-13: 1.5 mg via TRANSDERMAL

## 2011-09-13 MED ORDER — PHENYLEPHRINE HCL 10 MG/ML IJ SOLN
INTRAMUSCULAR | Status: DC | PRN
  Administered 2011-09-13: 40 ug via INTRAVENOUS
  Administered 2011-09-13: 80 ug via INTRAVENOUS
  Administered 2011-09-13 (×3): 40 ug via INTRAVENOUS
  Administered 2011-09-13: 80 ug via INTRAVENOUS

## 2011-09-13 MED ORDER — PROPOFOL 10 MG/ML IV BOLUS
INTRAVENOUS | Status: DC | PRN
  Administered 2011-09-13 (×6): 30 mg via INTRAVENOUS

## 2011-09-13 MED ORDER — IBUPROFEN 600 MG PO TABS: 600.0000 mg | ORAL_TABLET | Freq: Four times a day (QID) | ORAL | Status: DC | PRN

## 2011-09-13 MED ORDER — SODIUM CHLORIDE 0.9 % IJ SOLN
3.0000 mL | INTRAMUSCULAR | Status: DC | PRN
  Administered 2011-09-14: 3 mL via INTRAVENOUS

## 2011-09-13 MED ORDER — TEMAZEPAM 15 MG PO CAPS: 15.0000 mg | ORAL_CAPSULE | Freq: Every evening | ORAL | Status: DC | PRN

## 2011-09-13 MED ORDER — KETOROLAC TROMETHAMINE 30 MG/ML IJ SOLN: 30.0000 mg | Freq: Four times a day (QID) | INTRAMUSCULAR | Status: AC | PRN

## 2011-09-13 MED ORDER — LIDOCAINE HCL (CARDIAC) 20 MG/ML IV SOLN
INTRAVENOUS | Status: AC
  Filled 2011-09-13: qty 5

## 2011-09-13 MED ORDER — MEPERIDINE HCL 25 MG/ML IJ SOLN
6.2500 mg | INTRAMUSCULAR | Status: DC | PRN
  Administered 2011-09-13: 6.25 mg via INTRAVENOUS

## 2011-09-13 MED ORDER — SCOPOLAMINE 1 MG/3DAYS TD PT72
MEDICATED_PATCH | TRANSDERMAL | Status: AC
  Filled 2011-09-13: qty 1

## 2011-09-13 MED ORDER — NALOXONE HCL 0.4 MG/ML IJ SOLN: 0.4000 mg | INTRAMUSCULAR | Status: DC | PRN

## 2011-09-13 MED ORDER — FENTANYL CITRATE 0.05 MG/ML IJ SOLN
INTRAMUSCULAR | Status: DC | PRN
  Administered 2011-09-13: 15 ug via INTRATHECAL

## 2011-09-13 SURGICAL SUPPLY — 45 items
BARRIER ADHS 3X4 INTERCEED (GAUZE/BANDAGES/DRESSINGS) ×4 IMPLANT
BENZOIN TINCTURE PRP APPL 2/3 (GAUZE/BANDAGES/DRESSINGS) ×2 IMPLANT
CANISTER SUCTION 2500CC (MISCELLANEOUS) ×2 IMPLANT
CHLORAPREP W/TINT 26ML (MISCELLANEOUS) ×2 IMPLANT
CLOTH BEACON ORANGE TIMEOUT ST (SAFETY) ×2 IMPLANT
CONT PATH 16OZ SNAP LID 3702 (MISCELLANEOUS) ×2 IMPLANT
COVER LIGHT HANDLE  1/PK (MISCELLANEOUS) ×1
COVER LIGHT HANDLE 1/PK (MISCELLANEOUS) ×1 IMPLANT
DECANTER SPIKE VIAL GLASS SM (MISCELLANEOUS) ×2 IMPLANT
DRESSING TELFA 8X3 (GAUZE/BANDAGES/DRESSINGS) ×2 IMPLANT
DRSG COVADERM 4X10 (GAUZE/BANDAGES/DRESSINGS) ×2 IMPLANT
GLOVE BIO SURGEON STRL SZ 6.5 (GLOVE) ×4 IMPLANT
GLOVE INDICATOR 7.0 STRL GRN (GLOVE) ×2 IMPLANT
GLOVE INDICATOR 7.5 STRL GRN (GLOVE) ×2 IMPLANT
GLOVE SURG ORTHO 8.0 STRL STRW (GLOVE) ×2 IMPLANT
GLOVE SURG SS PI 6.5 STRL IVOR (GLOVE) ×2 IMPLANT
GLOVE SURG SS PI 7.0 STRL IVOR (GLOVE) ×2 IMPLANT
GOWN PREVENTION PLUS LG XLONG (DISPOSABLE) ×6 IMPLANT
GOWN SURGICAL XLG (GOWNS) ×2 IMPLANT
NEEDLE HYPO 21X1.5 SAFETY (NEEDLE) IMPLANT
NEEDLE HYPO 22GX1.5 SAFETY (NEEDLE) ×2 IMPLANT
NEEDLE HYPO 25X1 1.5 SAFETY (NEEDLE) ×2 IMPLANT
NS IRRIG 1000ML POUR BTL (IV SOLUTION) ×2 IMPLANT
PACK ABDOMINAL GYN (CUSTOM PROCEDURE TRAY) ×2 IMPLANT
PAD ABD 7.5X8 STRL (GAUZE/BANDAGES/DRESSINGS) ×2 IMPLANT
PAD OB MATERNITY 4.3X12.25 (PERSONAL CARE ITEMS) ×2 IMPLANT
SPONGE LAP 18X18 X RAY DECT (DISPOSABLE) ×2 IMPLANT
STAPLER VISISTAT 35W (STAPLE) ×2 IMPLANT
STRIP CLOSURE SKIN 1/2X4 (GAUZE/BANDAGES/DRESSINGS) ×2 IMPLANT
SUT PDS AB 0 CTX 60 (SUTURE) IMPLANT
SUT PLAIN 2 0 (SUTURE) ×1
SUT PLAIN ABS 2-0 CT1 27XMFL (SUTURE) ×1 IMPLANT
SUT VIC AB 0 CT1 27 (SUTURE) ×9
SUT VIC AB 0 CT1 27XBRD ANBCTR (SUTURE) ×9 IMPLANT
SUT VIC AB 2-0 CT1 27 (SUTURE) ×1
SUT VIC AB 2-0 CT1 TAPERPNT 27 (SUTURE) ×1 IMPLANT
SUT VIC AB 3-0 CT1 27 (SUTURE) ×1
SUT VIC AB 3-0 CT1 TAPERPNT 27 (SUTURE) ×1 IMPLANT
SUT VIC AB 4-0 KS 27 (SUTURE) ×2 IMPLANT
SYR CONTROL 10ML LL (SYRINGE) ×2 IMPLANT
SYRINGE 10CC LL (SYRINGE) ×2 IMPLANT
TAPE CLOTH SURG 4X10 WHT LF (GAUZE/BANDAGES/DRESSINGS) ×2 IMPLANT
TOWEL OR 17X24 6PK STRL BLUE (TOWEL DISPOSABLE) ×4 IMPLANT
TRAY FOLEY CATH 14FR (SET/KITS/TRAYS/PACK) ×2 IMPLANT
WATER STERILE IRR 1000ML POUR (IV SOLUTION) ×2 IMPLANT

## 2011-09-13 NOTE — Research (Signed)
Assisted OOB with steady tolerated well.  While sitted on the rocking chair patient claimed, " I feel that ringing on my ear again."  No fainting spell but patient turned pale.  Back to bed with steady assist and patient claims," I feel ok now that I'm in bed."  Vital signs taken and recorded, offered drinks.  Will continue to monitor.

## 2011-09-13 NOTE — Progress Notes (Signed)
UR Chart review completed.  

## 2011-09-13 NOTE — OR Nursing (Addendum)
Interceed 3" X 4"used intraoperatively by Dr. Vincente Poli, used two.

## 2011-09-13 NOTE — Addendum Note (Signed)
Addendum  created 09/13/11 1552 by Elbert Ewings, CRNA   Modules edited:Orders

## 2011-09-13 NOTE — Anesthesia Procedure Notes (Signed)
Spinal  Patient location during procedure: OR Start time: 09/13/2011 7:20 AM Staffing Anesthesiologist: CASSIDY, AMY Performed by: anesthesiologist  Preanesthetic Checklist Completed: patient identified, site marked, surgical consent, pre-op evaluation, timeout performed, IV checked, risks and benefits discussed and monitors and equipment checked Spinal Block Patient position: sitting Prep: DuraPrep Patient monitoring: continuous pulse ox, heart rate and blood pressure Approach: midline Location: L3-4 Injection technique: single-shot Needle Needle type: Sprotte  Needle gauge: 24 G Needle length: 9 cm Assessment Sensory level: T4 Additional Notes Clear free flow CSF on first attempt.  No paresthesia.  Patient tolerated procedure well.

## 2011-09-13 NOTE — Transfer of Care (Signed)
Immediate Anesthesia Transfer of Care Note  Patient: [name]  Procedure(s) Performed: Procedure(s) (LRB): MYOMECTOMY (N/A)  Patient Location: PACU  Anesthesia Type: Spinal  Level of Consciousness: awake, alert  and oriented  Airway & Oxygen Therapy: Patient Spontanous Breathing  Post-op Assessment: Report given to PACU RN and Post -op Vital signs reviewed and stable  Post vital signs: Reviewed and stable  Complications: No apparent anesthesia complications

## 2011-09-13 NOTE — Anesthesia Preprocedure Evaluation (Addendum)
Anesthesia Evaluation  Patient identified by MRN, date of birth, ID band Patient awake    Reviewed: Allergy & Precautions, H&P , NPO status , Patient's Chart, lab work & pertinent test results, reviewed documented beta blocker date and time   History of Anesthesia Complications Negative for: history of anesthetic complications  Airway Mallampati: I TM Distance: >3 FB Neck ROM: full    Dental  (+) Teeth Intact   Pulmonary neg pulmonary ROS,  breath sounds clear to auscultation  Pulmonary exam normal       Cardiovascular Exercise Tolerance: Good negative cardio ROS  Rhythm:regular Rate:Normal     Neuro/Psych Occasional neurocardiogenic syncope - hasn't happened in 2 years negative neurological ROS  negative psych ROS   GI/Hepatic negative GI ROS, Neg liver ROS,   Endo/Other  negative endocrine ROS  Renal/GU negative Renal ROS  negative genitourinary   Musculoskeletal   Abdominal   Peds  Hematology negative hematology ROS (+)   Anesthesia Other Findings   Reproductive/Obstetrics negative OB ROS                           Anesthesia Physical Anesthesia Plan  ASA: I  Anesthesia Plan: Spinal and MAC   Post-op Pain Management:    Induction:   Airway Management Planned:   Additional Equipment:   Intra-op Plan:   Post-operative Plan:   Informed Consent: I have reviewed the patients History and Physical, chart, labs and discussed the procedure including the risks, benefits and alternatives for the proposed anesthesia with the patient or authorized representative who has indicated his/her understanding and acceptance.   Dental Advisory Given  Plan Discussed with: CRNA and Surgeon  Anesthesia Plan Comments:       Anesthesia Quick Evaluation

## 2011-09-13 NOTE — Anesthesia Postprocedure Evaluation (Signed)
  Anesthesia Post-op Note  Patient: [name]  Procedure(s) Performed: Procedure(s) (LRB): MYOMECTOMY (N/A)  Patient Location: 304  Anesthesia Type: Spinal  Level of Consciousness: awake, alert  and oriented  Airway and Oxygen Therapy: Patient Spontanous Breathing and Patient connected to nasal cannula oxygen  Post-op Pain: mild  Post-op Assessment: Post-op Vital signs reviewed, No headache, No backache, No residual numbness and No residual motor weakness  Post-op Vital Signs: Reviewed,.  Visited patient about 20 minutes after she fainted while attempting to get out of bed with assistance. She was distressed that her heart rate and blood pressure had dropped. I reviewed her intaroperative anesthesia record and post operative vital signs. A 1 liter LR fluid bolus over one hour was ordered and the treatment plan was explained to the patient. A vital sign check after about 500 cc  of LR showed a rise in systolic B/P from the 70's to the 90"s.  Complications: No apparent anesthesia complications

## 2011-09-13 NOTE — Op Note (Signed)
NAMENEYLA, GAUNTT               ACCOUNT NO.:  [id number]  MEDICAL RECORD NO.:  [id number]  LOCATION:  [facility]                          FACILITY:  [facility]  PHYSICIAN:  Maeson Purohit L. Shawana Knoch, M.D.DATE OF BIRTH:  Jul 22, 1975  DATE OF PROCEDURE:  09/13/2011 DATE OF DISCHARGE:                              OPERATIVE REPORT   PREOPERATIVE DIAGNOSIS:  Symptomatic fibroids.  POSTOPERATIVE DIAGNOSIS:  Symptomatic fibroids.  PROCEDURE:  Abdominal myomectomy.  SURGEON:  Dr. Vincente Poli  ASSISTANT:  Dr. Rana Snare  ANESTHESIA:  Spinal.  ESTIMATED BLOOD LOSS:  300 mL.  COMPLICATIONS:  None.  DRAINS:  Foley.  PATHOLOGY:  Fibroid sent to pathology.  PROCEDURE:  The patient was taken to the operating room where spinal was placed by Dr. Rodman Pickle.  She was then prepped and draped.  A Foley catheter was inserted.  A low transverse incision was made and carried down to the fascia.  Fascia was scored in the midline, and the rectus muscles were entered in the midline and entered bluntly.  The peritoneum was entered.  We then elevated the uterus and noted that she had multiple fibroids and adnexa appeared normal.  Tubes appeared normal. We then proceeded with abdominal myomectomy by having to make multiple incisions by shelling out all the fibroids that we could see or palpate, there were approximately 20-25 fibroids that we removed.  There were multiple incisions made on the fundus anteriorly and posteriorly.  We did stay away from the endometrium, and we avoided the fallopian tubes. Each incision was closed in layers using 2-0 Vicryl.  Hemostasis was excellent.  Of note, this patient will have to have a C-section in the future because of the myomectomy and because of prior history of cesarean section.  Interceed was placed across the uterus in the back covering incisions and hemostasis was again noted.  The uterus was carefully replaced into the abdomen.  Peritoneum was closed using 0 Vicryl in a running  stitch.  Rectus muscles were reapproximated using 0 Vicryl.  The 0 Vicryl was used to close the fascia in a running stitch. After irrigation of the subcutaneous layer with noting hemostasis, I then excised some of the scar from her previous cesarean section, causing kind of a divot in her abdominal wall.  We then placed a plain gut suture interrupted in the subcutaneous layer and then a 4-0 Vicryl in a subcuticular to close the skin, and then at the end of the procedure, her incision was completely levelled.  All sponge, lap, and instrument counts were correct x2, and the patient went to recovery room in stable condition.     Cassidey Barrales L. Vincente Poli, M.D.     Florestine Avers  D:  09/13/2011  T:  09/13/2011  Job:  [id number]

## 2011-09-13 NOTE — Progress Notes (Signed)
History and physical on the chart. No changes Will proceed with abdominal myomectomies Patient consented

## 2011-09-13 NOTE — Anesthesia Postprocedure Evaluation (Signed)
Anesthesia Post Note  Patient: [name]  Procedure(s) Performed: Procedure(s) (LRB): MYOMECTOMY (N/A)  Anesthesia type: Spinal  Patient location: PACU  Post pain: Pain level controlled  Post assessment: Post-op Vital signs reviewed  Last Vitals:  Filed Vitals:   09/13/11 0604  BP: 116/78  Pulse: 89  Temp: 36.5 C  Resp: 18    Post vital signs: Reviewed  Level of consciousness: awake  Complications: No apparent anesthesia complications

## 2011-09-13 NOTE — Addendum Note (Signed)
Addendum  created 09/13/11 1653 by Graciela Husbands, CRNA   Modules edited:Notes Section

## 2011-09-13 NOTE — Brief Op Note (Signed)
09/13/2011  8:37 AM  PATIENT:  [name]  36 y.o. female  PRE-OPERATIVE DIAGNOSIS:  uterine fibroids, sympotomatic  POST-OPERATIVE DIAGNOSIS:  uterine fibroids  PROCEDURE:  Procedure(s) (LRB): Abdominal MYOMECTOMIES (N/A)  SURGEON:  Surgeon(s) and Role:    * Jeani Hawking, MD - Primary  PHYSICIAN ASSISTANT:   ASSISTANTS: Dr Rana Snare  ANESTHESIA:   spinal  EBL:  Total I/O In: 2000 [I.V.:2000] Out: 500 [Urine:100; Blood:400]  BLOOD ADMINISTERED:none  DRAINS: Urinary Catheter (Foley)   LOCAL MEDICATIONS USED:  NONE  SPECIMEN:  Source of Specimen:  fibroids  DISPOSITION OF SPECIMEN:  PATHOLOGY  COUNTS:  YES  TOURNIQUET:  * No tourniquets in log *  DICTATION: .Other Dictation: Dictation Number [id number]  PLAN OF CARE: Admit to inpatient   PATIENT DISPOSITION:  PACU - hemodynamically stable.   Delay start of Pharmacological VTE agent (>24hrs) due to surgical blood loss or risk of bleeding: yes

## 2011-09-13 NOTE — Plan of Care (Signed)
Problem: Phase I Progression Outcomes Goal: Dangle/OOB as tolerated per MD order Outcome: Progressing  Assisted OOB to rocking chair with steady, tolerated well.  But while sitting on the rocking chair patient complaint of ringing in her ears and turned pale.  Back to bed without incident and few minutes in the bed claimed she feels better.

## 2011-09-13 NOTE — Progress Notes (Signed)
Dr Vincente Poli  Notified of eposide of  Change in VS and  syncopy  Episode   Orders written  As given  Pt stable report given to oncoming shift

## 2011-09-14 LAB — BASIC METABOLIC PANEL
Calcium: 8 mg/dL — ABNORMAL LOW (ref 8.4–10.5)
GFR calc Af Amer: >90 mL/min (ref >90)
GFR calc non Af Amer: >90 mL/min (ref >90)
Potassium: 3.8 mEq/L (ref 3.5–5.1)
Sodium: 136 mEq/L (ref 135–145)

## 2011-09-14 LAB — CBC
MCHC: 34 g/dL (ref 30.0–36.0)
Platelets: 185 10*3/uL (ref 150–400)
RDW: 12.1 % (ref 11.5–15.5)

## 2011-09-14 MED ORDER — OXYCODONE-ACETAMINOPHEN 5-325 MG PO TABS
2.0000 | ORAL_TABLET | ORAL | Status: DC | PRN
  Administered 2011-09-14 (×2): 1 via ORAL
  Administered 2011-09-14 – 2011-09-15 (×2): 2 via ORAL
  Filled 2011-09-14: qty 1
  Filled 2011-09-14: qty 2
  Filled 2011-09-14: qty 1
  Filled 2011-09-14: qty 2

## 2011-09-14 MED ORDER — DOCUSATE SODIUM 100 MG PO CAPS
100.0000 mg | ORAL_CAPSULE | Freq: Every day | ORAL | Status: DC
  Administered 2011-09-14 – 2011-09-15 (×2): 100 mg via ORAL
  Filled 2011-09-14 (×2): qty 1

## 2011-09-14 MED ORDER — FERROUS SULFATE 325 (65 FE) MG PO TABS
325.0000 mg | ORAL_TABLET | Freq: Two times a day (BID) | ORAL | Status: DC
  Administered 2011-09-14 – 2011-09-15 (×3): 325 mg via ORAL
  Filled 2011-09-14 (×3): qty 1

## 2011-09-14 MED ORDER — LACTATED RINGERS IV BOLUS (SEPSIS): 1000.0000 mL | Freq: Once | INTRAVENOUS | Status: DC

## 2011-09-14 NOTE — Progress Notes (Signed)
1 Day Post-Op Procedure(s) (LRB): MYOMECTOMY (N/A)  Subjective: Patient reports tolerating PO.  Patient reports she has had history of syncope since she was in college.  She has had some episodes of feeling light headed and ears ringing when she is in upright position. Hemoglobin last evening 10 and this am is 8.7. She did eat something last night and is a little hungry.  She denies any abdominal pain at all but is having some shoulder pain  Objective: I have reviewed patient's vital signs, intake and output, medications and labs.  General: alert, cooperative and appears stated age Abdomen is soft and flat and non tender and non distended I removed her bandage and there was some drainage on the bandage but the incision is dry and intact. I cannot express any drainage. I placed Dermabond on her incision.   Assessment: s/p Procedure(s) (LRB): MYOMECTOMY (N/A):  Stable Patient is anemic after her procedure - not unusual given extensive nature of her myomectomies. I do think she has had some postoperative oozing but I do think that is stable. Given the anemia and the history of syncope, that would explain her dizziness   Plan: Advance diet Encourage ambulation Advance to PO medication I will give her 1 liter fluid bolus  Iron therapy Ambulate with assistance. Recheck CBC tomorrow  LOS: 1 day    Jing Howatt L 09/14/2011, 7:40 AM

## 2011-09-15 LAB — CBC
HCT: 24.1 % — ABNORMAL LOW (ref 36.0–46.0)
MCV: 93.1 fL (ref 78.0–100.0)
Platelets: 162 10*3/uL (ref 150–400)
RBC: 2.59 MIL/uL — ABNORMAL LOW (ref 3.87–5.11)
WBC: 5.5 10*3/uL (ref 4.0–10.5)

## 2011-09-15 MED ORDER — IBUPROFEN 600 MG PO TABS: 600.0000 mg | ORAL_TABLET | Freq: Four times a day (QID) | ORAL | Status: AC | PRN

## 2011-09-15 MED ORDER — OXYCODONE-ACETAMINOPHEN 5-325 MG PO TABS: 2.0000 | ORAL_TABLET | ORAL | Status: AC | PRN

## 2011-09-15 MED ORDER — FERROUS SULFATE 325 (65 FE) MG PO TABS: 325.0000 mg | ORAL_TABLET | Freq: Two times a day (BID) | ORAL | Status: AC

## 2011-09-15 NOTE — Discharge Instructions (Addendum)
Myomectomy Myoma is a non-cancerous tumor made up of fibrous tissue. It is also called leiomyoma, but more often called a fibroid tumor. Myomectomy is the removal of a fibroid tumor without removing another organ, like the uterus or ovary, with it. Fibroids range from the size of a pea to a grapefruit. They are rarely cancerous. Myomas only need treatment when they are growing or when they cause symptoms, such aspain, pressure, bleeding, and pain with intercourse. LET YOUR CAREGIVER KNOW ABOUT:  Any allergies, especially to medicines.   If you develop a cold or an infection before your surgery.   Medicines taken, including vitamins, herbs, eyedrops, over-ther-counter medicines, and creams.   Use of steroids (by mouth or creams).   Previous problems with numbing medicines.   History of blood clots or other bleeding problems.   Other health problems, such as diabetes, kidney, heart, or lung problems.   Previous surgery.   Possibility of pregnancy, if this applies.  RISKS AND COMPLICATIONS   Excessive bleeding.   Infection.   Injury to other organs.   Blood clots in the legs, chest, and brain.   Scar tissue (adhesions) on other organs and in the pelvis.   Death during or after the surgery.  BEFORE THE PROCEDURE  Follow your caregiver's advice regarding your surgery and preparing for surgery.   Avoid taking aspirin or blood thinners as directed by your caregiver.   DO NOT eat or drink anything after midnight on the night before surgery, or as directed by your caregiver.   DO NOT smoke (if you smoke) for 2 weeks before the surgery.   DO NOT drink alcohol the day before the surgery.   If you are admitted the day of the surgery,arrive1 hour before your surgery is scheduled.   Arrange to have someone take you home from the hospital.   Arrange to have someone care for you when you go home.  PROCEDURE There are several ways to perform a myomectomy:  Hysteroscopy  myomectomy. A lighted tube is inserted inside the uterus. The tube will remove the fibroid. This is used when the fibroid is inside the cavity of the uterus.   Laparoscopic myomectomy. A long, lighted tube is inserted through 2 or 3 small incisions to see the organs in the pelvis. The fibroid is removed.   Myomectomy through a sugical cut (incicion) in the abdomen. The fibroid is removed through an incision made in the stomach. This way is performed when thethe fibroid cannot be removed with a hysteroscope or laprascope.  AFTER THE PROCEDURE  If you had laparoscopic or hysteroscopic myomectomy, you may go home the same day or stay overnight.   If you had abdominal myomectomy, you may stay in the hospital a few days.   Your intravenous (IV)access tube and catheter will be removed in 1 or 2 days.   If you stay in the hospital, your caregiver will order pain medicine and a sleeping pill, if needed.   You may be placed on an antibiotic medicine, if needed.   You may be given written instructions and medicines before you are sent home.  Document Released: 04/08/2007 Document Revised: 05/31/2011 Document Reviewed: 04/20/2009 [employer] Patient Information 2012 [employer]. 

## 2011-09-15 NOTE — Discharge Summary (Signed)
Admission Diagnosis: Symptomatic Fibroids  Discharge Diagnosis: Same  Hospital Course: 36 year old Gravida 1 Para 1 admitted for Myomectomies. On day of admission, she underwent a myomectomy. Post op course complicated by anemia most likely due to extensive myomectomy. Her hemoglobin was stable on POD #1 and POD # 2.  She was not orthostatic She was tolerating pos, ambulating, voiding and had minimal pain on day of discharge. She was discharged home with iron, Ibuprofen, and percocet. She will follow up with me on Wednesday to check her incision. She was given her discharge instructions.

## 2011-09-15 NOTE — Progress Notes (Signed)
2 Days Post-Op Procedure(s) (LRB): MYOMECTOMY (N/A)  Subjective: Patient reports incisional pain, tolerating PO, + flatus and no problems voiding.  Minimal drainage from incision  Objective: I have reviewed patient's vital signs, intake and output, medications and labs.  Abdomen is soft and nontender,  Incision is clean, and dry and intact No drainage with exam  Assessment: s/p Procedure(s) (LRB): MYOMECTOMY (N/A): stable, progressing well and tolerating diet  Plan: Discharge home Rx Ibuprofen, Percocet, and Iron Followup on Wednesday for incision check Discharge instructions given to patient  LOS: 2 days    Muslima Toppins L 09/15/2011, 9:59 AM

## 2011-09-15 NOTE — Progress Notes (Signed)
D/C instructions reviewed with pt.  Pt verbally states understanding of home care.  No home equipment needed.  Wheelchair to car with staff without incident.  D/C'd home with mother.

## 2011-09-17 ENCOUNTER — Encounter (HOSPITAL_COMMUNITY): Payer: Self-pay | Admitting: Obstetrics and Gynecology

## 2012-04-14 LAB — OB RESULTS CONSOLE ABO/RH

## 2012-04-14 LAB — OB RESULTS CONSOLE HIV ANTIBODY (ROUTINE TESTING): HIV: NONREACTIVE

## 2012-08-25 ENCOUNTER — Encounter: Payer: Self-pay | Admitting: Internal Medicine

## 2012-08-25 ENCOUNTER — Ambulatory Visit (INDEPENDENT_AMBULATORY_CARE_PROVIDER_SITE_OTHER): Payer: BC Managed Care – PPO | Admitting: Internal Medicine

## 2012-08-25 VITALS — BP 115/77 | HR 108 | Ht 63.0 in | Wt 163.1 lb

## 2012-08-25 DIAGNOSIS — R002 Palpitations: Secondary | ICD-10-CM

## 2012-08-25 NOTE — Patient Instructions (Addendum)
Your physician has requested that you have an echocardiogram. Echocardiography is a painless test that uses sound waves to create images of your heart. It provides your doctor with information about the size and shape of your heart and how well your heart's chambers and valves are working. This procedure takes approximately one hour. There are no restrictions for this procedure.   

## 2012-08-25 NOTE — Progress Notes (Signed)
HPI Patient is a 37 yo who is now [redacted] wks pregnant (second preganancy) She has a history of syncope in past.  Seen by Odessa Fleming remotely.   Had tilt table  Positve  Put on Florinef Stopped taking at some point   Now has presyncope occasionally    Able to abate full spells by sitting, restin   2. Sun at [employer] warm dizzy hearing went  Sat down then   [place] outside  Felt drained after. Feels HR race more frequently  Daily    Rapid heart rate with last preg  In AM only  No dizziness.   Allergies  Allergen Reactions  . Bactrim Rash    Full body rash  . Cephalexin Rash    Full body rash  . Penicillins Rash    Full body rash  . Zithromax (Azithromycin) Rash    Full body rash    Current Outpatient Prescriptions  Medication Sig Dispense Refill  . cetirizine (ZYRTEC) 10 MG tablet Take 10 mg by mouth daily as needed. For allergies      . ranitidine (ZANTAC) 150 MG tablet Take 150 mg by mouth 2 (two) times daily.      . ferrous sulfate 325 (65 FE) MG tablet Take 1 tablet (325 mg total) by mouth 2 (two) times daily with a meal.  60 tablet  0   No current facility-administered medications for this visit.    Past Medical History  Diagnosis Date  . Asthma     inhaler Rx not filled-not used in 1 year, exercise/seasonal allergy induced  . GERD (gastroesophageal reflux disease)     occasional-uses zantac prn  . Headache     rare migraine  . Syncope     last time fainted over 2 years ago    Past Surgical History  Procedure Laterality Date  . Breast enhancement surgery  2007  . Cesarean section  2012  . Myomectomy  09/13/2011    Procedure: MYOMECTOMY;  Surgeon: Jeani Hawking, MD;  Location: [facility];  Service: Gynecology;  Laterality: N/A;  abdominal myomectomies    No family history on file.  History   Social History  . Marital Status: Married    Spouse Name: N/A    Number of Children: N/A  . Years of Education: N/A   Occupational History  . Not on file.    Social History Main Topics  . Smoking status: Never Smoker   . Smokeless tobacco: Not on file  . Alcohol Use: Yes     Comment: rare  . Drug Use: No  . Sexually Active:    Other Topics Concern  . Not on file   Social History Narrative  . No narrative on file    Review of Systems:  All systems reviewed.  They are negative to the above problem except as previously stated.  Vital Signs: BP 115/77  Pulse 108  Ht 5\' 3"  (1.6 m)  Wt 163 lb 1.9 oz (73.991 kg)  BMI 28.9 kg/m2  SpO2 98%  Physical Exam  HEENT:  Normocephalic, atraumatic. EOMI, PERRLA.  Neck: JVP is normal.  No bruits.  Lungs: clear to auscultation. No rales no wheezes.  Heart: Regular rate and rhythm. Normal S1, S2. No S3.   No significant murmurs. PMI not displaced.  Abdomen:  Supple, nontender. Normal bowel sounds. No masses. No hepatomegaly.  Extremities:   Good distal pulses throughout. No lower extremity edema.  Musculoskeletal :moving all extremities.  Neuro:  alert and oriented x3.  CN II-XII grossly intact.  EKG Sinus tachycardia  108 bpm.  Assessment and Plan:  Palpitations.  I am not convinced these represent an arrhythmia.  Most likely ST. Throid levels and CBC are normal I would recom a echo to rule out a structural problem with the heart  Patient should stay hydrated.  Rest as needed  Now and after pregnancy.

## 2012-08-26 ENCOUNTER — Institutional Professional Consult (permissible substitution): Payer: BC Managed Care – PPO | Admitting: Cardiovascular Disease

## 2012-08-26 ENCOUNTER — Encounter: Payer: Self-pay | Admitting: Internal Medicine

## 2012-09-03 ENCOUNTER — Ambulatory Visit (HOSPITAL_COMMUNITY): Payer: BC Managed Care – PPO | Attending: Cardiovascular Disease

## 2012-09-03 DIAGNOSIS — R002 Palpitations: Secondary | ICD-10-CM | POA: Insufficient documentation

## 2012-09-03 DIAGNOSIS — O99891 Other specified diseases and conditions complicating pregnancy: Secondary | ICD-10-CM | POA: Insufficient documentation

## 2012-09-03 DIAGNOSIS — R000 Tachycardia, unspecified: Secondary | ICD-10-CM | POA: Insufficient documentation

## 2012-09-03 DIAGNOSIS — R55 Syncope and collapse: Secondary | ICD-10-CM | POA: Insufficient documentation

## 2012-09-03 NOTE — Progress Notes (Signed)
Echocardiogram performed.  

## 2012-09-22 ENCOUNTER — Inpatient Hospital Stay (HOSPITAL_COMMUNITY): Payer: BC Managed Care – PPO

## 2012-09-22 ENCOUNTER — Inpatient Hospital Stay (HOSPITAL_COMMUNITY)
Admission: AD | Admit: 2012-09-22 | Discharge: 2012-09-25 | DRG: 886 | Disposition: A | Discharge status: Home / Self Care | Payer: BC Managed Care – PPO | Source: Ambulatory Visit | Attending: Obstetrics and Gynecology | Admitting: Obstetrics and Gynecology

## 2012-09-22 ENCOUNTER — Encounter (HOSPITAL_COMMUNITY): Payer: Self-pay | Admitting: *Deleted

## 2012-09-22 DIAGNOSIS — O4690 Antepartum hemorrhage, unspecified, unspecified trimester: Principal | ICD-10-CM | POA: Diagnosis present

## 2012-09-22 DIAGNOSIS — O34219 Maternal care for unspecified type scar from previous cesarean delivery: Secondary | ICD-10-CM | POA: Diagnosis present

## 2012-09-22 DIAGNOSIS — O09529 Supervision of elderly multigravida, unspecified trimester: Secondary | ICD-10-CM | POA: Diagnosis present

## 2012-09-22 DIAGNOSIS — O4693 Antepartum hemorrhage, unspecified, third trimester: Secondary | ICD-10-CM

## 2012-09-22 HISTORY — DX: Tachycardia, unspecified: R00.0

## 2012-09-22 HISTORY — DX: Benign neoplasm of connective and other soft tissue, unspecified: D21.9

## 2012-09-22 LAB — CBC
HCT: 33.5 % — ABNORMAL LOW (ref 36.0–46.0)
Hemoglobin: 11.3 g/dL — ABNORMAL LOW (ref 12.0–15.0)
MCH: 30.8 pg (ref 26.0–34.0)
MCHC: 33.7 g/dL (ref 30.0–36.0)
MCV: 91.3 fL (ref 78.0–100.0)
Platelets: 254 10*3/uL (ref 150–400)
RBC: 3.67 MIL/uL — ABNORMAL LOW (ref 3.87–5.11)
RDW: 13 % (ref 11.5–15.5)
WBC: 11.1 10*3/uL — ABNORMAL HIGH (ref 4.0–10.5)

## 2012-09-22 LAB — SAMPLE TO BLOOD BANK

## 2012-09-22 MED ORDER — ACETAMINOPHEN 325 MG PO TABS
650.0000 mg | ORAL_TABLET | ORAL | Status: DC | PRN
  Administered 2012-09-23: 650 mg via ORAL
  Filled 2012-09-22: qty 2

## 2012-09-22 MED ORDER — PRENATAL MULTIVITAMIN CH
1.0000 | ORAL_TABLET | Freq: Every day | ORAL | Status: DC
  Filled 2012-09-22: qty 1

## 2012-09-22 MED ORDER — CALCIUM CARBONATE ANTACID 500 MG PO CHEW: 2.0000 | CHEWABLE_TABLET | ORAL | Status: DC | PRN

## 2012-09-22 MED ORDER — LACTATED RINGERS IV SOLN
INTRAVENOUS | Status: DC
  Administered 2012-09-23: 15:00:00 via INTRAVENOUS

## 2012-09-22 MED ORDER — LACTATED RINGERS IV SOLN
INTRAVENOUS | Status: DC
  Administered 2012-09-22 – 2012-09-24 (×2): via INTRAVENOUS

## 2012-09-22 MED ORDER — DOCUSATE SODIUM 100 MG PO CAPS
100.0000 mg | ORAL_CAPSULE | Freq: Every day | ORAL | Status: DC
  Administered 2012-09-23 – 2012-09-25 (×3): 100 mg via ORAL
  Filled 2012-09-22 (×3): qty 1

## 2012-09-22 MED ORDER — ZOLPIDEM TARTRATE 5 MG PO TABS
5.0000 mg | ORAL_TABLET | Freq: Every evening | ORAL | Status: DC | PRN
  Administered 2012-09-23 – 2012-09-24 (×3): 5 mg via ORAL
  Filled 2012-09-22 (×3): qty 1

## 2012-09-22 MED ORDER — HYDROXYPROGESTERONE CAPROATE 250 MG/ML IM OIL
250.0000 mg | TOPICAL_OIL | INTRAMUSCULAR | Status: DC
Start: 2012-09-23 — End: 2012-09-23

## 2012-09-22 MED ORDER — BETAMETHASONE SOD PHOS & ACET 6 (3-3) MG/ML IJ SUSP
12.0000 mg | INTRAMUSCULAR | Status: AC
  Administered 2012-09-22 – 2012-09-23 (×2): 12 mg via INTRAMUSCULAR
  Filled 2012-09-22 (×2): qty 2

## 2012-09-22 NOTE — MAU Provider Note (Signed)
History     CSN: [id number]  Arrival date and time: 09/22/12 1807   First Provider Initiated Contact with Patient 09/22/12 1931      Chief Complaint  Patient presents with  . Vaginal Bleeding   HPI  Pt is G2P0101 at [redacted]w[redacted]d pregnant with hx of myomectomy 3/21/2013of ~20 fibroids and C/S 01/02/2011 for breech at 28weeks with PROM and delivery at [redacted]w[redacted]d. Pt is on 17 P-( pt gets on Tuesdays)Today pt started bleeding when going to the toilet at 5 pm - dripping on the floor. she bled only when went to the bathroom and wiped.  Pt has mild episodic RLQ pain, otherwise no pain.  Pt denies complications with her pregnancy.  Pt is not having ctx. Pt has not had sex recently.    Past Medical History  Diagnosis Date  . Asthma     inhaler Rx not filled-not used in 1 year, exercise/seasonal allergy induced  . GERD (gastroesophageal reflux disease)     occasional-uses zantac prn  . Headache     rare migraine  . Syncope     last time fainted over 2 years ago  . Tachycardia   . Fibroid     Past Surgical History  Procedure Laterality Date  . Breast enhancement surgery  2007  . Cesarean section  2012  . Myomectomy  09/13/2011    Procedure: MYOMECTOMY;  Surgeon: Jeani Hawking, MD;  Location: [facility];  Service: Gynecology;  Laterality: N/A;  abdominal myomectomies    Family History  Problem Relation Age of Onset  . Heart disease Maternal Grandmother   . Stroke Maternal Grandmother   . Cancer Maternal Grandfather     History  Substance Use Topics  . Smoking status: Never Smoker   . Smokeless tobacco: Not on file  . Alcohol Use: Yes     Comment: rare    Allergies:  Allergies  Allergen Reactions  . Bactrim Rash    Full body rash  . Cephalexin Rash    Full body rash  . Penicillins Rash    Full body rash  . Zithromax (Azithromycin) Rash    Full body rash    Prescriptions prior to admission  Medication Sig Dispense Refill  . cetirizine (ZYRTEC) 10 MG tablet Take 10 mg by  mouth daily as needed. For allergies      . ranitidine (ZANTAC) 150 MG tablet Take 150 mg by mouth 2 (two) times daily.        Review of Systems  Constitutional: Negative for fever and chills.  Gastrointestinal: Negative for nausea, vomiting and abdominal pain.  Genitourinary: Negative for dysuria and urgency.   Physical Exam   Blood pressure 124/74, pulse 107, temperature 97.9 F (36.6 C), temperature source Oral, resp. rate 16, height 5\' 4"  (1.626 m), weight 77.202 kg (170 lb 3.2 oz).  Physical Exam  Vitals reviewed. Constitutional: She is oriented to person, place, and time. She appears well-developed and well-nourished.  Anxious appearing  HENT:  Head: Normocephalic.  Eyes: Pupils are equal, round, and reactive to light.  Neck: Normal range of motion.  Cardiovascular: Normal rate.   Respiratory: Effort normal.  GI: Soft. She exhibits no distension. There is no tenderness. There is no rebound and no guarding.  FHR reassuring for gestational age; occ ctx  Genitourinary:  Speculum exam uncomfortable with graves so Peterson speculum utilized without visualization of cervical os- sm-mod amount of bright red blood in   Musculoskeletal: Normal range of motion.  Neurological: She is  alert and oriented to person, place, and time.  Skin: Skin is warm and dry.  Psychiatric: She has a normal mood and affect.    MAU Course  Procedures Discussed with Dr. Rana Snare- will admit to antenatal Discussed plans with pt and husband Also discussed steroids with pt for fetal lung maturity  Assessment and Plan    LINEBERRY,SUSAN 09/22/2012, 7:34 PM

## 2012-09-22 NOTE — MAU Note (Signed)
Hx of 28 week PTD.

## 2012-09-22 NOTE — MAU Note (Signed)
Pt noted bleeding when using bathroom @ 1700, was on tissue & in toilet.  Pt used restroom here & noted bleeding.  Denies pain.

## 2012-09-23 ENCOUNTER — Inpatient Hospital Stay (HOSPITAL_COMMUNITY): Payer: BC Managed Care – PPO

## 2012-09-23 LAB — KLEIHAUER-BETKE STAIN
# Vials RhIg: 1
Fetal Cells %: 0 %

## 2012-09-23 MED ORDER — MAGNESIUM SULFATE BOLUS VIA INFUSION
4.0000 g | Freq: Once | INTRAVENOUS | Status: AC
  Administered 2012-09-23: 4 g via INTRAVENOUS
  Filled 2012-09-23: qty 500

## 2012-09-23 MED ORDER — COMPLETENATE 29-1 MG PO CHEW
1.0000 | CHEWABLE_TABLET | Freq: Every day | ORAL | Status: DC
  Administered 2012-09-23 – 2012-09-25 (×3): 1 via ORAL
  Filled 2012-09-23 (×3): qty 1

## 2012-09-23 MED ORDER — HYDROXYPROGESTERONE CAPROATE 250 MG/ML IM OIL
250.0000 mg | TOPICAL_OIL | INTRAMUSCULAR | Status: DC
  Filled 2012-09-23: qty 1

## 2012-09-23 MED ORDER — HYDROXYPROGESTERONE CAPROATE 250 MG/ML IM OIL
250.0000 mg | TOPICAL_OIL | INTRAMUSCULAR | Status: DC
  Administered 2012-09-23: 250 mg via INTRAMUSCULAR
  Filled 2012-09-23: qty 1

## 2012-09-23 MED ORDER — MAGNESIUM SULFATE 40 G IN LACTATED RINGERS - SIMPLE
2.0000 g/h | INTRAVENOUS | Status: AC
  Administered 2012-09-23: 2 g/h via INTRAVENOUS
  Filled 2012-09-23: qty 500

## 2012-09-23 NOTE — H&P (Signed)
History      CSN: [id number]   Arrival date and time: 09/22/12 1807    First Provider Initiated Contact with Patient 09/22/12 1931          Chief Complaint   Patient presents with   .  Vaginal Bleeding    HPI   Pt is G2P0101 at [redacted]w[redacted]d pregnant with hx of myomectomy 3/21/2013of ~20 fibroids and C/S 01/02/2011 for breech at 28weeks with PROM and delivery at [redacted]w[redacted]d. Pt is on 17 P-( pt gets on Tuesdays)Today pt started bleeding when going to the toilet at 5 pm - dripping on the floor. she bled only when went to the bathroom and wiped.  Pt has mild episodic RLQ pain, otherwise no pain.  Pt denies complications with her pregnancy.  Pt is not having ctx. Pt has not had sex recently.      Past Medical History   Diagnosis  Date   .  Asthma         inhaler Rx not filled-not used in 1 year, exercise/seasonal allergy induced   .  GERD (gastroesophageal reflux disease)         occasional-uses zantac prn   .  Headache         rare migraine   .  Syncope         last time fainted over 2 years ago   .  Tachycardia     .  Fibroid         Past Surgical History   Procedure  Laterality  Date   .  Breast enhancement surgery    2007   .  Cesarean section    2012   .  Myomectomy    09/13/2011       Procedure: MYOMECTOMY;  Surgeon: Jeani Hawking, MD;  Location: [facility];  Service: Gynecology;  Laterality: N/A;  abdominal myomectomies       Family History   Problem  Relation  Age of Onset   .  Heart disease  Maternal Grandmother     .  Stroke  Maternal Grandmother     .  Cancer  Maternal Grandfather         History   Substance Use Topics   .  Smoking status:  Never Smoker    .  Smokeless tobacco:  Not on file   .  Alcohol Use:  Yes         Comment: rare      Allergies:   Allergies   Allergen  Reactions   .  Bactrim  Rash       Full body rash   .  Cephalexin  Rash       Full body rash   .  Penicillins  Rash       Full body rash   .  Zithromax (Azithromycin)  Rash        Full body rash       Prescriptions prior to admission   Medication  Sig  Dispense  Refill   .  cetirizine (ZYRTEC) 10 MG tablet  Take 10 mg by mouth daily as needed. For allergies         .  ranitidine (ZANTAC) 150 MG tablet  Take 150 mg by mouth 2 (two) times daily.            Review of Systems  Constitutional: Negative for fever and chills.  Gastrointestinal: Negative for nausea, vomiting and abdominal pain.  Genitourinary:  Negative for dysuria and urgency.  Physical Exam      Blood pressure 124/74, pulse 107, temperature 97.9 F (36.6 C), temperature source Oral, resp. rate 16, height 5\' 4"  (1.626 m), weight 77.202 kg (170 lb 3.2 oz).   Physical Exam  Vitals reviewed. Constitutional: She is oriented to person, place, and time. She appears well-developed and well-nourished.  Anxious appearing  HENT:   Head: Normocephalic.  Eyes: Pupils are equal, round, and reactive to light.  Neck: Normal range of motion.  Cardiovascular: Normal rate.   Respiratory: Effort normal.  GI: Soft. She exhibits no distension. There is no tenderness. There is no rebound and no guarding.  FHR reassuring for gestational age; occ ctx  Genitourinary:  Speculum exam uncomfortable with graves so Peterson speculum utilized without visualization of cervical os- sm-mod amount of bright red blood in   Musculoskeletal: Normal range of motion.  Neurological: She is alert and oriented to person, place, and time.  Skin: Skin is warm and dry.  Psychiatric: She has a normal mood and affect.   US done *RADIOLOGY REPORT*  Clinical Data: 37 year old with LMP 02/12/2012 (established  gestational age 50s one weeks 6 days), presenting with vaginal  bleeding.  LIMITED OBSTETRIC ULTRASOUND WITH TRANSVAGINAL EVALUATION  Number of Fetuses: 1  Heart Rate: 165 bpm  Movement: Visualized.  Presentation: Cephalic.  Placental Location: Anterior and right lateral.  Previa: No.  Amniotic Fluid (Subjective): Normal.   AFI 22.3 cm (5%ile = 8.6 cm; 95%ile = 24.2 cm)  Femur length: 6.12 cm 31 w 6 d EDC: 11/18/2012.  MATERNAL FINDINGS:  Cervix: 2.6 cm measured transvaginally, closed.  Uterus/Adnexae: Both ovaries normal in size and appearance, the  left measuring approximately 3.4 x 1.7 x 2.0 cm and the right  measuring approximately 3.1 x 2.2 x 2.1 cm.  IMPRESSION:  1. Single live intrauterine fetus in cephalic presentation.  2. Subjectively and objectively normal amniotic fluid.  3. Anterior and right lateral placenta without evidence of previa.  4. Closed cervix measuring approximately 2.6 cm transvaginally.   Fetal monitor - Ctxs q 5-10 minutes mild FHR 140s with accels and no decels No vaginal bleeding at this time   Impression  Vaginal bleeding at 32 weeks with history of 28 weeks PPROM and 29 week delivery last pregnancy No evidence of abruption ,  KB is pending .  She is RH negative Ctxs and some cervical change  Plan  In hospital monitoring and management of vaginal bleeding FU KB results.  Rhogam as needed BMZ series started. Discussed and plan IV Magnesium for Cx change and mild ctxs and CP prophylaxis Plan [place] with MFM in 2 days to evaluate cause of Bleeding (Placenta and cervix) Pt receives progesterone 250 IM weekly (due Tuesday) DL

## 2012-09-23 NOTE — Progress Notes (Signed)
37 yo G2 P0101 @ 32 weeks admitted w/ VB.  Reports VB has resolved.  No lof or ctx. Pt w./ h/o c-section & myomectomy  FHT reassuring toco 2-3/hr Gen - NAD Abd - gravid, NT PV - deferred Ext - no edema  A/P:  VB in pregnancy, no evidence of abruption on [place] Continue mag x 12 hrs for CP prophylaxis then d/c Continue course of BMZ Rpt [place] today w/ MFM Bedrest SCDs

## 2012-09-23 NOTE — Progress Notes (Addendum)
[facility] ultrasound  Indication: 37 yr old G110P0101 at [redacted]w[redacted]d with vaginal bleeding for fetal ultrasound. History of PPROM and preterm delivery.  Findings: 1. Single intrauterine pregnancy. 2. Estimated fetal weight is in the 75th%. 3. Anterior placenta without evidence of previa. No evidence of retroplacental bleed seen. 4. Normal amniotic fluid index. 5. The limited anatomy survey is normal.  Recommendations: 1. Appropriate fetal growth. 2. Normal limited anatomy survey: - anatomy survey limited by advanced gestational age and fetal position 3. Vaginal bleeding: - patient had transvaginal cervical length done yesterday which was 2.5cm-2.7cm; which is slightly decreased but still within normal range - unclear etiology; patient reports nonpainful contractions - recommend discontinue magnesium sulfate as delivery does not appear imminent and do not recommend magnesium sulfate for fetal neuroprotection after 32 weeks - recommend complete course of betamethasone - recommend perform cervical exam now and after completion of betamethasone course and if no cervical change and no further vaginal bleeding may qualify for outpatient management - do not recommend bed rest; recommend no strenuous activity - recommend preterm labor/PPROM and bleeding precautions - recommend inpatient monitoring until at least 24-48 hours without bleeding with close fetal monitoring - slightly decreased cervical length- recommend cervical exam and close surveillance for evidence of preterm labor/PPROM 4. History of PPROM and preterm delivery: - patient is on 17OH progesterone; recommend continue until 36 weeks - discussed with this history there is an increased risk of preterm delivery 5. Previous C section and myomectomy: plans delivery by repeat C section 6. Advanced maternal age: previously counseled  Eulis Foster, MD

## 2012-09-24 MED ORDER — SODIUM CHLORIDE 0.9 % IJ SOLN
3.0000 mL | Freq: Two times a day (BID) | INTRAMUSCULAR | Status: DC
  Administered 2012-09-24: 3 mL via INTRAVENOUS

## 2012-09-24 NOTE — Progress Notes (Signed)
Ur chart review completed.  

## 2012-09-24 NOTE — Plan of Care (Signed)
Problem: Consults Goal: Birthing Suites Patient Information Press F2 to bring up selections list   Pt < [redacted] weeks EGA     

## 2012-09-24 NOTE — Progress Notes (Signed)
32 1/7 weeks No C/O, no pain, no bleeding yesterday or today so far, good FM  Blood pressure 101/47, pulse 106, temperature 97.9 F (36.6 C), temperature source Oral, resp. rate 16, height 5\' 4"  (1.626 m), weight 170 lb 3.2 oz (77.202 kg). Uterus soft, NT FHT reactive UCs none  A: No further bleeding     S/P BMTZ     MFM consult done  P: Will D/C IV fluids      Observe today      If no further bleeding, check cervix and consider D/C tomorrow      D/W patient

## 2012-09-25 NOTE — Progress Notes (Signed)
Discharged via wheel chair with family

## 2012-09-25 NOTE — Progress Notes (Signed)
Patient is doing well No more vaginal bleeding No cramping. Reports good fetal movement.  Afebrile Vital signs stable General alert and oriented Lung CTAB Car RRR Cervix long/closed  IMPRESSION: IUP at 32 w 2 days Vaginal bleeding of unknown etiology  PLAN: Discharge home Bedrest Follow up on Monday

## 2012-09-25 NOTE — Discharge Summary (Signed)
Admission Diagnosis: IUP at 32 weeks Vaginal bleeding Previous Cesarean Section  Discharge Diagnosis: Same  Hospital Course: 37 year old female at 39 w 2 days admitted with vaginal bleeding of unknown etiology After admission, steroids were administered. MFM consult was obtained and it revealed that she did not have a placental abruption. She was very stable during hospital stay. She was discharged home after 2 days no vaginal bleeding. She will follow bedrest Follow up in office on Monday with Dr. Vincente Poli.

## 2012-09-26 LAB — TYPE AND SCREEN
ABO/RH(D): A NEG
Antibody Screen: POSITIVE
Unit division: 0

## 2012-10-13 ENCOUNTER — Encounter (HOSPITAL_COMMUNITY): Payer: Self-pay | Admitting: Pharmacy Technician

## 2012-10-27 ENCOUNTER — Encounter (HOSPITAL_COMMUNITY)
Admission: RE | Admit: 2012-10-27 | Discharge: 2012-10-27 | Disposition: A | Discharge status: Home / Self Care | Payer: BC Managed Care – PPO | Source: Ambulatory Visit | Attending: Obstetrics and Gynecology | Admitting: Obstetrics and Gynecology

## 2012-10-27 ENCOUNTER — Encounter (HOSPITAL_COMMUNITY): Payer: Self-pay

## 2012-10-27 HISTORY — DX: Cardiac arrhythmia, unspecified: I49.9

## 2012-10-27 HISTORY — DX: Anemia, unspecified: D64.9

## 2012-10-27 LAB — CBC
HCT: 32 % — ABNORMAL LOW (ref 36.0–46.0)
MCH: 29.9 pg (ref 26.0–34.0)
MCV: 90.1 fL (ref 78.0–100.0)
RDW: 13.5 % (ref 11.5–15.5)
WBC: 9.6 10*3/uL (ref 4.0–10.5)

## 2012-10-27 NOTE — Patient Instructions (Signed)
Your procedure is scheduled on:10/28/12  Enter through the Main Entrance at :6am Pick up desk phone and dial [phone] and inform us of your arrival.  Please call [phone] if you have any problems the morning of surgery.  Remember: Do not eat or drink after midnight: tonight   Take these meds the morning of surgery with a sip of water: NONE   DO NOT wear jewelry, eye make-up, lipstick,body lotion, or dark fingernail polish.   If you are to be admitted after surgery, leave suitcase in car until your room has been assigned.

## 2012-10-28 ENCOUNTER — Inpatient Hospital Stay (HOSPITAL_COMMUNITY)
Admission: AD | Admit: 2012-10-28 | Discharge: 2012-10-30 | DRG: 371 | Disposition: A | Discharge status: Home / Self Care | Payer: BC Managed Care – PPO | Source: Ambulatory Visit | Attending: Obstetrics and Gynecology | Admitting: Obstetrics and Gynecology

## 2012-10-28 ENCOUNTER — Encounter (HOSPITAL_COMMUNITY)
Admission: AD | Disposition: A | Discharge status: Home / Self Care | Payer: Self-pay | Source: Ambulatory Visit | Attending: Obstetrics and Gynecology

## 2012-10-28 ENCOUNTER — Encounter (HOSPITAL_COMMUNITY): Payer: Self-pay | Admitting: Anesthesiology

## 2012-10-28 ENCOUNTER — Inpatient Hospital Stay (HOSPITAL_COMMUNITY): Payer: BC Managed Care – PPO | Admitting: Anesthesiology

## 2012-10-28 ENCOUNTER — Encounter (HOSPITAL_COMMUNITY): Payer: Self-pay | Admitting: *Deleted

## 2012-10-28 DIAGNOSIS — O34219 Maternal care for unspecified type scar from previous cesarean delivery: Principal | ICD-10-CM | POA: Diagnosis present

## 2012-10-28 DIAGNOSIS — O09529 Supervision of elderly multigravida, unspecified trimester: Secondary | ICD-10-CM | POA: Diagnosis present

## 2012-10-28 SURGERY — Surgical Case
Anesthesia: Spinal | Site: Abdomen | Wound class: Clean Contaminated

## 2012-10-28 MED ORDER — OXYTOCIN 10 UNIT/ML IJ SOLN
INTRAMUSCULAR | Status: AC
  Filled 2012-10-28: qty 4

## 2012-10-28 MED ORDER — METOCLOPRAMIDE HCL 5 MG/ML IJ SOLN: 10.0000 mg | Freq: Three times a day (TID) | INTRAMUSCULAR | Status: DC | PRN

## 2012-10-28 MED ORDER — MORPHINE SULFATE 0.5 MG/ML IJ SOLN
INTRAMUSCULAR | Status: AC
  Filled 2012-10-28: qty 10

## 2012-10-28 MED ORDER — ONDANSETRON HCL 4 MG/2ML IJ SOLN: 4.0000 mg | Freq: Three times a day (TID) | INTRAMUSCULAR | Status: DC | PRN

## 2012-10-28 MED ORDER — NALBUPHINE HCL 10 MG/ML IJ SOLN
5.0000 mg | INTRAMUSCULAR | Status: DC | PRN
  Filled 2012-10-28: qty 1

## 2012-10-28 MED ORDER — KETOROLAC TROMETHAMINE 60 MG/2ML IM SOLN: 60.0000 mg | Freq: Once | INTRAMUSCULAR | Status: AC | PRN

## 2012-10-28 MED ORDER — LANOLIN HYDROUS EX OINT: 1.0000 "application " | TOPICAL_OINTMENT | CUTANEOUS | Status: DC | PRN

## 2012-10-28 MED ORDER — FENTANYL CITRATE 0.05 MG/ML IJ SOLN
INTRAMUSCULAR | Status: DC | PRN
  Administered 2012-10-28: 12.5 ug via INTRATHECAL

## 2012-10-28 MED ORDER — DIPHENHYDRAMINE HCL 50 MG/ML IJ SOLN: 12.5000 mg | INTRAMUSCULAR | Status: DC | PRN

## 2012-10-28 MED ORDER — 0.9 % SODIUM CHLORIDE (POUR BTL) OPTIME
TOPICAL | Status: DC | PRN
  Administered 2012-10-28: 1000 mL

## 2012-10-28 MED ORDER — NALOXONE HCL 0.4 MG/ML IJ SOLN: 0.4000 mg | INTRAMUSCULAR | Status: DC | PRN

## 2012-10-28 MED ORDER — FENTANYL CITRATE 0.05 MG/ML IJ SOLN
INTRAMUSCULAR | Status: AC
  Filled 2012-10-28: qty 2

## 2012-10-28 MED ORDER — TETANUS-DIPHTH-ACELL PERTUSSIS 5-2.5-18.5 LF-MCG/0.5 IM SUSP: 0.5000 mL | Freq: Once | INTRAMUSCULAR | Status: DC

## 2012-10-28 MED ORDER — DIPHENHYDRAMINE HCL 25 MG PO CAPS
25.0000 mg | ORAL_CAPSULE | ORAL | Status: DC | PRN
  Filled 2012-10-28: qty 1

## 2012-10-28 MED ORDER — ONDANSETRON HCL 4 MG/2ML IJ SOLN
INTRAMUSCULAR | Status: AC
  Filled 2012-10-28: qty 2

## 2012-10-28 MED ORDER — MEPERIDINE HCL 25 MG/ML IJ SOLN: 6.2500 mg | INTRAMUSCULAR | Status: DC | PRN

## 2012-10-28 MED ORDER — LACTATED RINGERS IV SOLN
INTRAVENOUS | Status: DC
  Administered 2012-10-28 (×2): via INTRAVENOUS

## 2012-10-28 MED ORDER — SODIUM CHLORIDE 0.9 % IJ SOLN: 3.0000 mL | INTRAMUSCULAR | Status: DC | PRN

## 2012-10-28 MED ORDER — DIBUCAINE 1 % RE OINT: 1.0000 "application " | TOPICAL_OINTMENT | RECTAL | Status: DC | PRN

## 2012-10-28 MED ORDER — ONDANSETRON HCL 4 MG PO TABS: 4.0000 mg | ORAL_TABLET | ORAL | Status: DC | PRN

## 2012-10-28 MED ORDER — DIPHENHYDRAMINE HCL 50 MG/ML IJ SOLN: 25.0000 mg | INTRAMUSCULAR | Status: DC | PRN

## 2012-10-28 MED ORDER — PHENYLEPHRINE HCL 10 MG/ML IJ SOLN
INTRAMUSCULAR | Status: DC | PRN
  Administered 2012-10-28: 80 ug via INTRAVENOUS
  Administered 2012-10-28: 40 ug via INTRAVENOUS
  Administered 2012-10-28 (×2): 80 ug via INTRAVENOUS
  Administered 2012-10-28 (×2): 40 ug via INTRAVENOUS

## 2012-10-28 MED ORDER — SIMETHICONE 80 MG PO CHEW
80.0000 mg | CHEWABLE_TABLET | ORAL | Status: DC | PRN
  Administered 2012-10-30: 80 mg via ORAL

## 2012-10-28 MED ORDER — WITCH HAZEL-GLYCERIN EX PADS: 1.0000 "application " | MEDICATED_PAD | CUTANEOUS | Status: DC | PRN

## 2012-10-28 MED ORDER — KETOROLAC TROMETHAMINE 30 MG/ML IJ SOLN: 30.0000 mg | Freq: Four times a day (QID) | INTRAMUSCULAR | Status: AC | PRN

## 2012-10-28 MED ORDER — ZOLPIDEM TARTRATE 5 MG PO TABS: 5.0000 mg | ORAL_TABLET | Freq: Every evening | ORAL | Status: DC | PRN

## 2012-10-28 MED ORDER — BUPIVACAINE IN DEXTROSE 0.75-8.25 % IT SOLN
INTRATHECAL | Status: DC | PRN
  Administered 2012-10-28: 1.4 mL via INTRATHECAL

## 2012-10-28 MED ORDER — HYDROMORPHONE HCL PF 1 MG/ML IJ SOLN: 0.2500 mg | INTRAMUSCULAR | Status: DC | PRN

## 2012-10-28 MED ORDER — SCOPOLAMINE 1 MG/3DAYS TD PT72: 1.0000 | MEDICATED_PATCH | Freq: Once | TRANSDERMAL | Status: DC

## 2012-10-28 MED ORDER — KETOROLAC TROMETHAMINE 60 MG/2ML IM SOLN
INTRAMUSCULAR | Status: AC
  Administered 2012-10-28: 60 mg via INTRAMUSCULAR
  Filled 2012-10-28: qty 2

## 2012-10-28 MED ORDER — SENNOSIDES-DOCUSATE SODIUM 8.6-50 MG PO TABS
2.0000 | ORAL_TABLET | Freq: Every day | ORAL | Status: DC
  Administered 2012-10-28 – 2012-10-29 (×2): 2 via ORAL

## 2012-10-28 MED ORDER — MENTHOL 3 MG MT LOZG: 1.0000 | LOZENGE | OROMUCOSAL | Status: DC | PRN

## 2012-10-28 MED ORDER — CLINDAMYCIN PHOSPHATE 900 MG/50ML IV SOLN
900.0000 mg | Freq: Once | INTRAVENOUS | Status: AC
  Administered 2012-10-28: 900 mg via INTRAVENOUS
  Filled 2012-10-28: qty 50

## 2012-10-28 MED ORDER — SCOPOLAMINE 1 MG/3DAYS TD PT72
MEDICATED_PATCH | TRANSDERMAL | Status: AC
  Administered 2012-10-28: 1.5 mg via TRANSDERMAL
  Filled 2012-10-28: qty 1

## 2012-10-28 MED ORDER — NALBUPHINE SYRINGE 5 MG/0.5 ML
INJECTION | INTRAMUSCULAR | Status: AC
  Administered 2012-10-28: 10 mg via SUBCUTANEOUS
  Filled 2012-10-28: qty 1

## 2012-10-28 MED ORDER — ACETAMINOPHEN 10 MG/ML IV SOLN
1000.0000 mg | Freq: Four times a day (QID) | INTRAVENOUS | Status: AC | PRN
  Administered 2012-10-28: 1000 mg via INTRAVENOUS
  Filled 2012-10-28 (×2): qty 100

## 2012-10-28 MED ORDER — SODIUM CHLORIDE 0.9 % IJ SOLN: 3.0000 mL | Freq: Two times a day (BID) | INTRAMUSCULAR | Status: DC

## 2012-10-28 MED ORDER — MORPHINE SULFATE (PF) 0.5 MG/ML IJ SOLN
INTRAMUSCULAR | Status: DC | PRN
  Administered 2012-10-28: .2 mg via INTRATHECAL

## 2012-10-28 MED ORDER — PHENYLEPHRINE 40 MCG/ML (10ML) SYRINGE FOR IV PUSH (FOR BLOOD PRESSURE SUPPORT)
PREFILLED_SYRINGE | INTRAVENOUS | Status: AC
  Filled 2012-10-28: qty 10

## 2012-10-28 MED ORDER — IBUPROFEN 600 MG PO TABS
600.0000 mg | ORAL_TABLET | Freq: Four times a day (QID) | ORAL | Status: DC
  Administered 2012-10-28 (×2): 600 mg via ORAL
  Filled 2012-10-28 (×4): qty 1

## 2012-10-28 MED ORDER — NALOXONE HCL 1 MG/ML IJ SOLN
1.0000 ug/kg/h | INTRAVENOUS | Status: DC | PRN
  Filled 2012-10-28: qty 2

## 2012-10-28 MED ORDER — ONDANSETRON HCL 4 MG/2ML IJ SOLN
INTRAMUSCULAR | Status: DC | PRN
  Administered 2012-10-28: 4 mg via INTRAVENOUS

## 2012-10-28 MED ORDER — ONDANSETRON HCL 4 MG/2ML IJ SOLN: 4.0000 mg | INTRAMUSCULAR | Status: DC | PRN

## 2012-10-28 MED ORDER — FLEET ENEMA 7-19 GM/118ML RE ENEM: 1.0000 | ENEMA | Freq: Every day | RECTAL | Status: DC | PRN

## 2012-10-28 MED ORDER — BISACODYL 10 MG RE SUPP: 10.0000 mg | Freq: Every day | RECTAL | Status: DC | PRN

## 2012-10-28 MED ORDER — SODIUM CHLORIDE 0.9 % IV SOLN: 250.0000 mL | INTRAVENOUS | Status: DC

## 2012-10-28 MED ORDER — PRENATAL MULTIVITAMIN CH: 1.0000 | ORAL_TABLET | Freq: Every day | ORAL | Status: DC

## 2012-10-28 MED ORDER — OXYTOCIN 40 UNITS IN LACTATED RINGERS INFUSION - SIMPLE MED: 62.5000 mL/h | INTRAVENOUS | Status: AC

## 2012-10-28 MED ORDER — OXYCODONE-ACETAMINOPHEN 5-325 MG PO TABS
1.0000 | ORAL_TABLET | ORAL | Status: DC | PRN
  Administered 2012-10-29 – 2012-10-30 (×6): 2 via ORAL
  Filled 2012-10-28: qty 2
  Filled 2012-10-28: qty 1
  Filled 2012-10-28: qty 2
  Filled 2012-10-28: qty 1
  Filled 2012-10-28: qty 2
  Filled 2012-10-28: qty 1
  Filled 2012-10-28 (×2): qty 2

## 2012-10-28 MED ORDER — SIMETHICONE 80 MG PO CHEW
80.0000 mg | CHEWABLE_TABLET | Freq: Three times a day (TID) | ORAL | Status: DC
  Administered 2012-10-28 – 2012-10-30 (×5): 80 mg via ORAL

## 2012-10-28 MED ORDER — LACTATED RINGERS IV SOLN
INTRAVENOUS | Status: DC
  Administered 2012-10-28 (×4): via INTRAVENOUS

## 2012-10-28 MED ORDER — DIPHENHYDRAMINE HCL 25 MG PO CAPS: 25.0000 mg | ORAL_CAPSULE | Freq: Four times a day (QID) | ORAL | Status: DC | PRN

## 2012-10-28 SURGICAL SUPPLY — 31 items
BARRIER ADHS 3X4 INTERCEED (GAUZE/BANDAGES/DRESSINGS) IMPLANT
CLOTH BEACON ORANGE TIMEOUT ST (SAFETY) ×2 IMPLANT
CONTAINER PREFILL 10% NBF 15ML (MISCELLANEOUS) IMPLANT
DERMABOND ADVANCED (GAUZE/BANDAGES/DRESSINGS) ×1
DERMABOND ADVANCED .7 DNX12 (GAUZE/BANDAGES/DRESSINGS) ×1 IMPLANT
DRAPE LG THREE QUARTER DISP (DRAPES) ×2 IMPLANT
DRSG OPSITE POSTOP 4X10 (GAUZE/BANDAGES/DRESSINGS) ×2 IMPLANT
DURAPREP 26ML APPLICATOR (WOUND CARE) ×2 IMPLANT
ELECT REM PT RETURN 9FT ADLT (ELECTROSURGICAL) ×2
ELECTRODE REM PT RTRN 9FT ADLT (ELECTROSURGICAL) ×1 IMPLANT
EXTRACTOR VACUUM M CUP 4 TUBE (SUCTIONS) ×2 IMPLANT
GLOVE BIO SURGEON STRL SZ 6.5 (GLOVE) ×2 IMPLANT
GOWN STRL REIN XL XLG (GOWN DISPOSABLE) ×4 IMPLANT
KIT ABG SYR 3ML LUER SLIP (SYRINGE) ×2 IMPLANT
NEEDLE HYPO 22GX1.5 SAFETY (NEEDLE) IMPLANT
NEEDLE HYPO 25X5/8 SAFETYGLIDE (NEEDLE) ×2 IMPLANT
NS IRRIG 1000ML POUR BTL (IV SOLUTION) ×2 IMPLANT
PACK C SECTION WH (CUSTOM PROCEDURE TRAY) ×2 IMPLANT
PAD OB MATERNITY 4.3X12.25 (PERSONAL CARE ITEMS) ×2 IMPLANT
STAPLER VISISTAT 35W (STAPLE) IMPLANT
SUT CHROMIC 0 CTX 36 (SUTURE) ×8 IMPLANT
SUT MON AB 4-0 PS1 27 (SUTURE) ×2 IMPLANT
SUT PLAIN 0 NONE (SUTURE) IMPLANT
SUT PLAIN 2 0 XLH (SUTURE) ×4 IMPLANT
SUT VIC AB 0 CT1 27 (SUTURE) ×3
SUT VIC AB 0 CT1 27XBRD ANBCTR (SUTURE) ×3 IMPLANT
SUT VIC AB 4-0 KS 27 (SUTURE) IMPLANT
SYR CONTROL 10ML LL (SYRINGE) IMPLANT
TOWEL OR 17X24 6PK STRL BLUE (TOWEL DISPOSABLE) ×6 IMPLANT
TRAY FOLEY CATH 14FR (SET/KITS/TRAYS/PACK) ×2 IMPLANT
WATER STERILE IRR 1000ML POUR (IV SOLUTION) IMPLANT

## 2012-10-28 NOTE — Brief Op Note (Signed)
10/28/2012  8:29 AM  PATIENT:  Oris Drone  37 y.o. female  PRE-OPERATIVE DIAGNOSIS:  REPEAT, PREVIOUS MYOMECTOMY CPT [phone]  POST-OPERATIVE DIAGNOSIS:  REPEAT, PREVIOUS MYOMECTOMY  PROCEDURE:  Procedure(s) with comments: CESAREAN SECTION (N/A) - REPEAT EDC 11/18/12  SURGEON:  Surgeon(s) and Role:    * Juluis Mire, MD - Primary    * Leslie Andrea, MD - Assisting  PHYSICIAN ASSISTANT:   ASSISTANTS: tomblin   ANESTHESIA:   spinal  EBL:  Total I/O In: 3000 [I.V.:3000] Out: 875 [Urine:75; Blood:800]  BLOOD ADMINISTERED:none  DRAINS: Urinary Catheter (Foley)   LOCAL MEDICATIONS USED:  NONE  SPECIMEN:  No Specimen  DISPOSITION OF SPECIMEN:  N/A  COUNTS:  YES  TOURNIQUET:  * No tourniquets in log *  DICTATION: .Other Dictation: Dictation Number [id number]  PLAN OF CARE: Admit to inpatient   PATIENT DISPOSITION:  PACU - hemodynamically stable.   Delay start of Pharmacological VTE agent (>24hrs) due to surgical blood loss or risk of bleeding: no

## 2012-10-28 NOTE — Op Note (Signed)
NAMECONNER, [name]               ACCOUNT NO.:  [id number]  MEDICAL RECORD NO.:  [id number]  LOCATION:  9126                          FACILITY:  [facility]  PHYSICIAN:  Juluis Mire, M.D.   DATE OF BIRTH:  10/10/1975  DATE OF PROCEDURE:  10/28/2012 DATE OF DISCHARGE:                              OPERATIVE REPORT   PREOPERATIVE DIAGNOSES: 1. Pregnancy at 37 weeks. 2. Prior cesarean section. 3. Prior myomectomy. 4. Repeat cesarean section.  POSTOPERATIVE DIAGNOSES: 1. Pregnancy at 37 weeks. 2. Prior cesarean section. 3. Prior myomectomy. 4. Repeat cesarean section.  PROCEDURE:  Low transverse cesarean section.  SURGEON:  Juluis Mire, M.D.  ASSISTANT:  Guy Sandifer. Henderson Cloud, M.D.  ANESTHESIA:  Spinal.  ESTIMATED BLOOD LOSS:  400 mL.  PACKS:  None.  DRAINS:  Urethral Foley.  INTRAOPERATIVE BLOOD REPLACED:  None.  COMPLICATIONS:  None.  INDICATION:  Dictated in H and P.  DESCRIPTION OF PROCEDURE:  The patient was taken to the OR and placed in supine position with left lateral tilt.  After satisfactory level of spinal anesthesia was obtained, the abdomen was prepped with DuraPrep and draped in sterile field.  Prior low transverse incision was identified and excised.  The incision was extended through the subcutaneous tissue.  Fascia was entered sharp and an incision fashioned laterally.  Fascia taken off the muscle superiorly and inferiorly. Rectus muscles were separated in the midline.  Perineum was entered sharply.  Incision of perineum was extended both superiorly and inferiorly.  There was no scarring.  Low transverse bladder flap was developed.  Low transverse uterine incision begun with knife extended laterally using manual traction.  The infant was in the vertex presentation with clear fluid.  Delivered with the aid of the vacuum extractor and fundal pressure.  The infant was a viable female, Apgars were 9/10.  Placenta was delivered manually.  Uterus was closed in  a running interlocking suture of 0 chromic using a two-layer closure technique.  We had good hemostasis, clear urine output.  Both tubes and ovaries were visualized, noted to be normal.  Muscles and peritoneum closed in a running suture of 3-0 Vicryl.  Fascia was closed with running suture of 0 PDS.  Subcutaneous tissue was closed with interrupted sutures of 2-0 plain catgut.  Skin was closed in running subcuticular 4-0 Monocryl.  Dermabond was also applied.  Sponge, instrument, needle count was reported as correct by circulating nurse multiple times.  Urine output remained clear at the time of closure. The patient tolerated the procedure well and was returned to the recovery room in good condition.     Juluis Mire, M.D.     JSM/MEDQ  D:  10/28/2012  T:  10/28/2012  Job:  [id number]

## 2012-10-28 NOTE — Op Note (Signed)
Patient name  [name], [name] DICTATION#  [id number] CSN# [id number]  [facility], MD 10/28/2012 8:32 AM

## 2012-10-28 NOTE — Anesthesia Preprocedure Evaluation (Signed)

## 2012-10-28 NOTE — Anesthesia Postprocedure Evaluation (Signed)
  Anesthesia Post Note  Patient: [name]  Procedure(s) Performed: Procedure(s) (LRB): CESAREAN SECTION (N/A)  Anesthesia type: Spinal  Patient location: PACU  Post pain: Pain level controlled  Post assessment: Post-op Vital signs reviewed  Last Vitals:  Filed Vitals:   10/28/12 0611  BP: 122/84  Pulse: 100  Temp: 36.8 C  Resp: 20    Post vital signs: Reviewed  Level of consciousness: awake  Complications: No apparent anesthesia complications

## 2012-10-28 NOTE — H&P (Signed)
[name] is a 37 y.o. female presenting at 33 weeks for repeat cesarean section per Dr Vincente Poli. Drucilla Schmidt hospitalization dr Gwenlyn Found to perform surgery, so as back up will procede Maternal Medical History:  Fetal activity: Perceived fetal activity is normal.    Prenatal complications: Prior myomectomy. Prior cesarean section.  History of preterm labor.  fibroids  Prenatal Complications - Diabetes: none.    OB History   Grav Para Term Preterm Abortions TAB SAB Ect Mult Living   2 1  1      1      Past Medical History  Diagnosis Date  . Asthma     inhaler Rx not filled-not used in 1 year, exercise/seasonal allergy induced  . GERD (gastroesophageal reflux disease)     occasional-uses zantac prn  . Headache     rare migraine  . Syncope     last time fainted over 2 years ago  . Tachycardia   . Fibroid   . Dysrhythmia     palpitations- sinus tachycardia  . Anemia    Past Surgical History  Procedure Laterality Date  . Breast enhancement surgery  2007  . Cesarean section  2012  . Myomectomy  09/13/2011    Procedure: MYOMECTOMY;  Surgeon: Jeani Hawking, MD;  Location: [facility];  Service: Gynecology;  Laterality: N/A;  abdominal myomectomies  . Breast surgery     Family History: family history includes Cancer in her maternal grandfather; Heart disease in her maternal grandmother; and Stroke in her maternal grandmother. Social History:  reports that she has never smoked. She has never used smokeless tobacco. She reports that  drinks alcohol. She reports that she does not use illicit drugs.   Prenatal Transfer Tool  Maternal Diabetes: No Genetic Screening: Normal Maternal Ultrasounds/Referrals: Normal Fetal Ultrasounds or other Referrals:  None Maternal Substance Abuse:  No Significant Maternal Medications:  None Significant Maternal Lab Results:  None Other Comments:  None  ROS    Blood pressure 122/84, pulse 100, temperature 98.2 F (36.8 C), temperature source  Oral, resp. rate 20, SpO2 98.00%. Maternal Exam:  Abdomen: Surgical scars: low transverse.     Physical Exam  Constitutional: She is oriented to person, place, and time. She appears well-developed and well-nourished.  Cardiovascular: Normal rate, regular rhythm and normal heart sounds.   Respiratory: Effort normal and breath sounds normal.  GI:  Gravid uterus.  Low transverse incision  Neurological: She is alert and oriented to person, place, and time. She has normal reflexes.    Prenatal labs: ABO, Rh: --/--/A NEG (05/05 1213) Antibody: POS (05/05 1213) Rubella:   RPR: NON REACTIVE (05/05 1212)  HBsAg: Negative (10/21 0000)  HIV: Non-reactive (10/21 0000)  GBS:     Assessment/Plan:IUP at 37 weeks for repeat cesarean section Prior myomectomy and cesarean section Risk of cesarean section discussed.  These include:  Risk of infection;  Risk of hemorrhage that could require transfusions with the associated risk of aids or hepatitis;  Excessive bleeding could require hysterectomy;  Risk of injury to adjacent organs including bladder, bowel or ureters;  Risk of DVT's and possible pulmonary embolus.  Patient expresses a understanding of indications and risks.;   Tymier Lindholm S 10/28/2012, 6:38 AM

## 2012-10-28 NOTE — H&P (Signed)
  History and physical exam unchanged 

## 2012-10-28 NOTE — Transfer of Care (Signed)
Immediate Anesthesia Transfer of Care Note  Patient: [name]  Procedure(s) Performed: Procedure(s) with comments: CESAREAN SECTION (N/A) - REPEAT EDC 11/18/12  Patient Location: PACU  Anesthesia Type:Spinal  Level of Consciousness: awake, alert  and oriented  Airway & Oxygen Therapy: Patient Spontanous Breathing  Post-op Assessment: Report given to PACU RN and Post -op Vital signs reviewed and stable  Post vital signs: stable  Complications: No apparent anesthesia complications

## 2012-10-28 NOTE — Anesthesia Procedure Notes (Signed)
Spinal  Patient location during procedure: OR Start time: 10/28/2012 7:27 AM End time: 10/28/2012 7:31 AM Staffing Anesthesiologist: Sandrea Hughs Performed by: anesthesiologist  Preanesthetic Checklist Completed: patient identified, site marked, surgical consent, pre-op evaluation, timeout performed, IV checked, risks and benefits discussed and monitors and equipment checked Spinal Block Patient position: sitting Prep: DuraPrep Patient monitoring: heart rate, cardiac monitor, continuous pulse ox and blood pressure Approach: midline Location: L3-4 Injection technique: single-shot Needle Needle type: Sprotte  Needle gauge: 24 G Needle length: 9 cm Needle insertion depth: 7 cm Assessment Sensory level: T4

## 2012-10-28 NOTE — Anesthesia Postprocedure Evaluation (Signed)
  Anesthesia Post-op Note  Patient: [name]  Procedure(s) Performed: Procedure(s) with comments: CESAREAN SECTION (N/A) - REPEAT EDC 11/18/12  Patient Location: Mother/Baby  Anesthesia Type:Spinal  Level of Consciousness: awake, alert , oriented and patient cooperative  Airway and Oxygen Therapy: Patient Spontanous Breathing  Post-op Pain: mild  Post-op Assessment: Patient's Cardiovascular Status Stable, Respiratory Function Stable, No headache, No backache, No residual numbness and No residual motor weakness  Post-op Vital Signs: stable  Complications: No apparent anesthesia complications

## 2012-10-29 ENCOUNTER — Encounter (HOSPITAL_COMMUNITY): Payer: Self-pay | Admitting: Obstetrics and Gynecology

## 2012-10-29 LAB — CBC
HCT: 27.8 % — ABNORMAL LOW (ref 36.0–46.0)
Hemoglobin: 9.1 g/dL — ABNORMAL LOW (ref 12.0–15.0)
MCHC: 32.7 g/dL (ref 30.0–36.0)
MCV: 89.4 fL (ref 78.0–100.0)
RDW: 13.8 % (ref 11.5–15.5)
WBC: 10.5 10*3/uL (ref 4.0–10.5)

## 2012-10-29 MED ORDER — RHO D IMMUNE GLOBULIN 1500 UNIT/2ML IJ SOLN
300.0000 ug | Freq: Once | INTRAMUSCULAR | Status: DC
  Filled 2012-10-29: qty 2

## 2012-10-29 NOTE — Progress Notes (Signed)
Subjective: Postpartum Day 1: Cesarean Delivery Patient reports tolerating PO, + flatus and no problems voiding.    Objective: Vital signs in last 24 hours: Temp:  [97.5 F (36.4 C)-98.5 F (36.9 C)] 98.1 F (36.7 C) (05/07 0610) Pulse Rate:  [59-99] 80 (05/07 0610) Resp:  [17-18] 18 (05/07 0610) BP: (101-130)/(61-88) 123/76 mmHg (05/07 0610) SpO2:  [97 %-100 %] 98 % (05/07 0430) Weight:  [175 lb (79.379 kg)] 175 lb (79.379 kg) (05/06 0840)  Physical Exam:  General: alert and cooperative Lochia: appropriate Uterine Fundus: firm Incision: honeycomb dressing noted CDI DVT Evaluation: No evidence of DVT seen on physical exam. Negative Homan's sign. No cords or calf tenderness. No significant calf/ankle edema.   Recent Labs  10/27/12 1213 10/29/12 0635  HGB 10.6* 9.1*  HCT 32.0* 27.8*    Assessment/Plan: Status post Cesarean section. Doing well postoperatively.  Continue current care.  Jackeline Gutknecht G 10/29/2012, 7:48 AM

## 2012-10-30 MED ORDER — RHO D IMMUNE GLOBULIN 1500 UNIT/2ML IJ SOLN
300.0000 ug | Freq: Once | INTRAMUSCULAR | Status: AC
  Administered 2012-10-30: 300 ug via INTRAMUSCULAR
  Filled 2012-10-30: qty 2

## 2012-10-30 MED ORDER — OXYCODONE-ACETAMINOPHEN 5-325 MG PO TABS
1.0000 | ORAL_TABLET | ORAL | Status: DC | PRN
Start: 2012-10-30 — End: 2015-11-24

## 2012-10-30 NOTE — Discharge Summary (Signed)
Obstetric Discharge Summary Reason for Admission: cesarean section Prenatal Procedures: ultrasound Intrapartum Procedures: cesarean: low cervical, transverse Postpartum Procedures: none Complications-Operative and Postpartum: none Hemoglobin  Date Value Range Status  10/29/2012 9.1* 12.0 - 15.0 g/dL Final     HCT  Date Value Range Status  10/29/2012 27.8* 36.0 - 46.0 % Final    Physical Exam:  General: alert and cooperative Lochia: appropriate Uterine Fundus: firm Incision: honeycomb dressing noted CDI DVT Evaluation: No evidence of DVT seen on physical exam. Negative Homan's sign. No cords or calf tenderness. No significant calf/ankle edema.  Discharge Diagnoses: Term Pregnancy-delivered  Discharge Information: Date: 10/30/2012 Activity: pelvic rest Diet: routine Medications: PNV and Percocet Condition: stable Instructions: refer to practice specific booklet Discharge to: home   Newborn Data: Live born female  Birth Weight: 7 lb 0.2 oz (3180 g) APGAR: 9, 10  Home with mother.  [name] G 10/30/2012, 8:01 AM

## 2012-10-31 LAB — TYPE AND SCREEN
ABO/RH(D): A NEG
Antibody Screen: POSITIVE
Unit division: 0

## 2012-10-31 LAB — RH IG WORKUP (INCLUDES ABO/RH)
ABO/RH(D): A NEG
Gestational Age(Wks): 37
Unit division: 0

## 2012-12-10 ENCOUNTER — Other Ambulatory Visit: Payer: Self-pay | Admitting: Obstetrics and Gynecology

## 2013-10-19 IMAGING — US US OB DETAIL+14 WK
1 series · 16 of 28 positions shown · non-contrast
Comparison: none

OBSTETRICS REPORT
                    (Corrected Final 09/23/2012 [DATE])

Service(s) Provided
 US OB DETAIL + 14 WK                                  76811.0
Indications
 Detailed fetal anatomic survey
 Previous cesarean section
 Vaginal bleeding, unknown etiology
 Poor obstetric history: Previous preterm delivery
 Uterine scar, other than C/S
 Rh negative
 Advanced maternal age (AMA), Multigravida
Fetal Evaluation
 Num Of Fetuses:    1
 Fetal Heart Rate:  131                          bpm
 Cardiac Activity:  Observed
 Presentation:      Cephalic
 Placenta:          Anterior, above cervical os
 P. Cord            Visualized
 Insertion:
 Amniotic Fluid
 AFI FV:      Subjectively within normal limits
 AFI Sum:     15.83   cm       57  %Tile     Larg Pckt:    5.52  cm
 RUQ:   5.52    cm   RLQ:    3.68   cm    LUQ:   3.62    cm   LLQ:    3.01   cm
Biometry
 BPD:     85.6  mm     G. Age:  34w 4d                CI:         79.6   70 - 86
 OFD:    107.5  mm                                    FL/HC:      18.4   19.1 -
 HC:     312.4  mm     G. Age:  35w 0d       87  %    HC/AC:      1.04   0.96 -
 AC:     299.2  mm     G. Age:  33w 6d       92  %    FL/BPD:     67.2   71 - 87
 FL:      57.5  mm     G. Age:  30w 1d        5  %    FL/AC:      19.2   20 - 24
 HUM:     52.3  mm     G. Age:  30w 3d       20  %
 Est. FW:    0455  gm    4 lb 10 oz      75  %
Gestational Age
 LMP:           32w 0d        Date:  02/12/12                 EDD:   11/18/12
 U/S Today:     33w 3d                                        EDD:   11/08/12
 Best:          32w 0d     Det. By:  LMP  (02/12/12)          EDD:   11/18/12
Anatomy
 Cranium:          Appears normal         Aortic Arch:      Not well visualized
 Fetal Cavum:      Not well visualized    Ductal Arch:      Appears normal
 Ventricles:       Not well visualized    Diaphragm:        Appears normal
 Choroid Plexus:   Appears normal         Stomach:          Appears normal, left
                                                            sided
 Cerebellum:       Not well visualized    Abdomen:          Appears normal
 Posterior Fossa:  Not well visualized    Abdominal Wall:   Appears nml (cord
                                                            insert, abd wall)
 Nuchal Fold:      Not applicable (>20    Cord Vessels:     Appears normal (3
                   wks GA)                                  vessel cord)
 Face:             Appears normal         Kidneys:          Appear normal
                   (orbits and profile)
 Lips:             Not well visualized    Bladder:          Appears normal
 Palate:           Not well visualized    Spine:            Appears normal
 Heart:            Appears normal         Lower             Not well visualized
                   (4CH, axis, and        Extremities:
                   situs)
 RVOT:             Appears normal         Upper             Not well visualized
                                          Extremities:
 LVOT:             Appears normal
 Other:  Heels appears normal. Fetus appears to be a female.
Cervix Uterus Adnexa
 Cervix:       Not visualized (advanced GA >64wks)
 Left Ovary:    Not visualized. No adnexal mass visualized.
 Right Ovary:   Within normal limits.
Impression
INDICATION: 37 yr old PYHSPSP at 45w7d with vaginal bleeding
 for fetal ultrasound. History of PPROM and preterm delivery.

[Series 1: us ob detail+14 wk · 0.28mm/px · 16 of 85 slices shown]
[im 1/85]
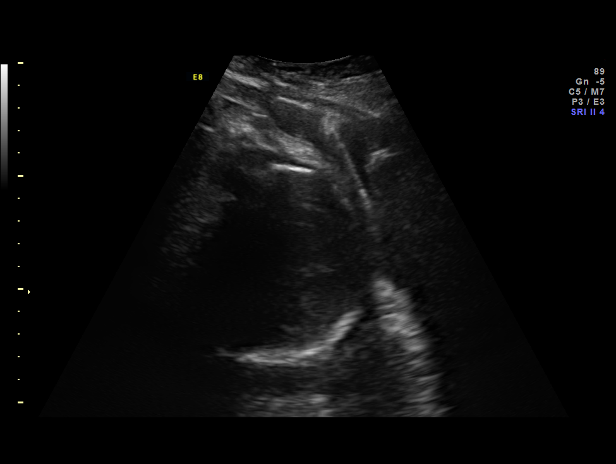
[im 7/85]
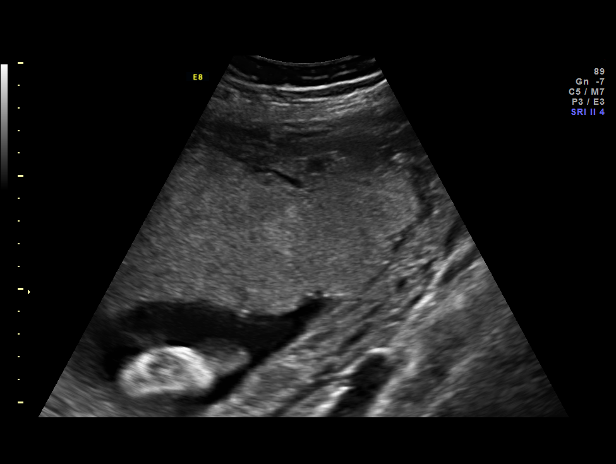
[im 13/85]
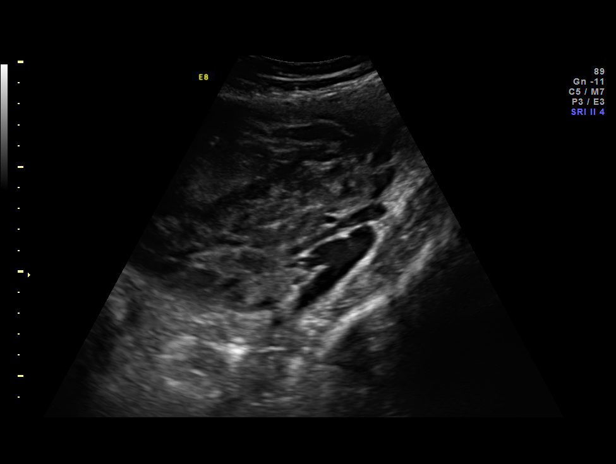
[im 19/85]
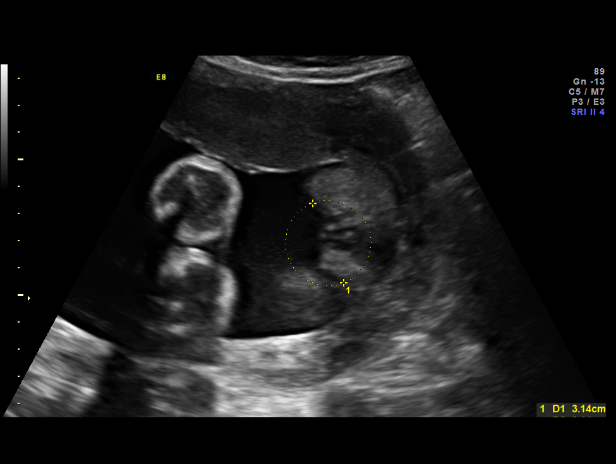
[im 22/85]
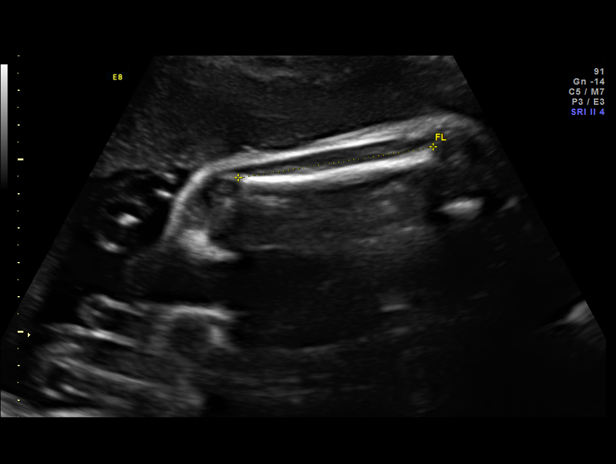
[im 29/85]
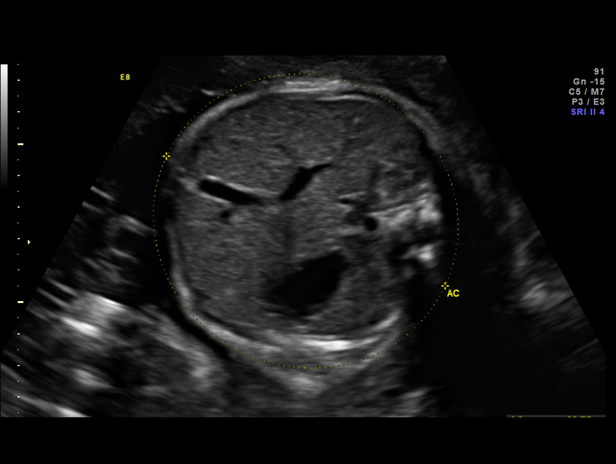
[im 35/85]
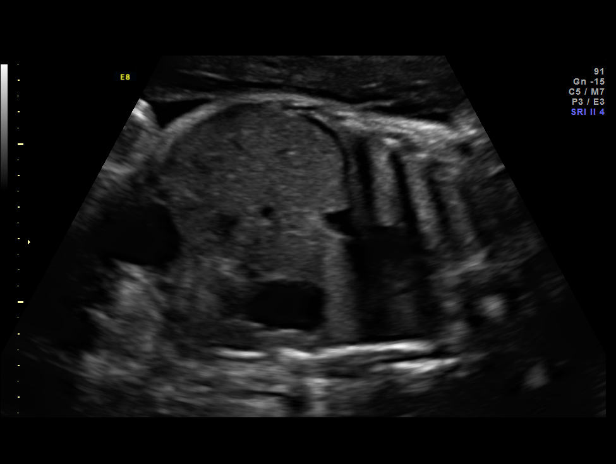
[im 41/85]
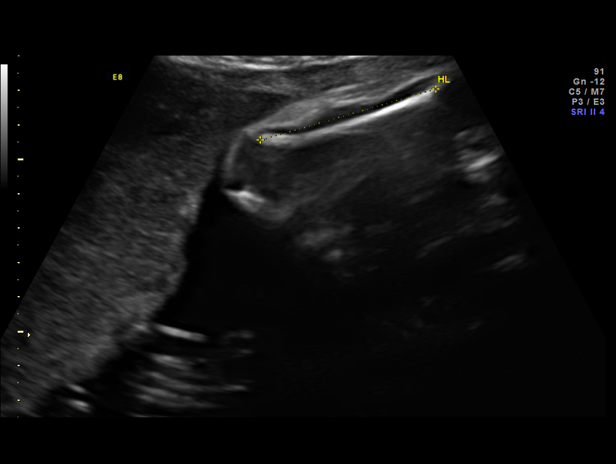
[im 44/85]
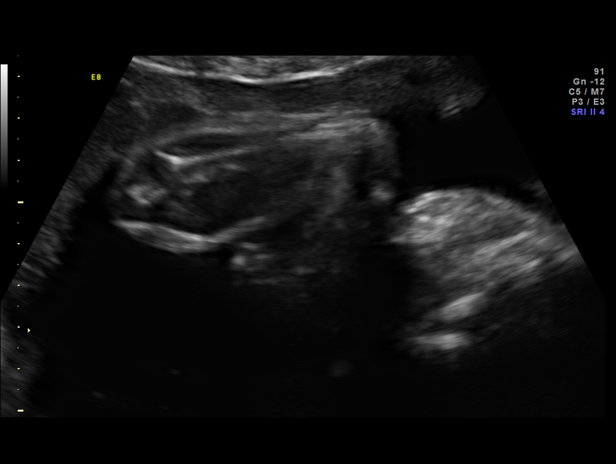
[im 50/85]
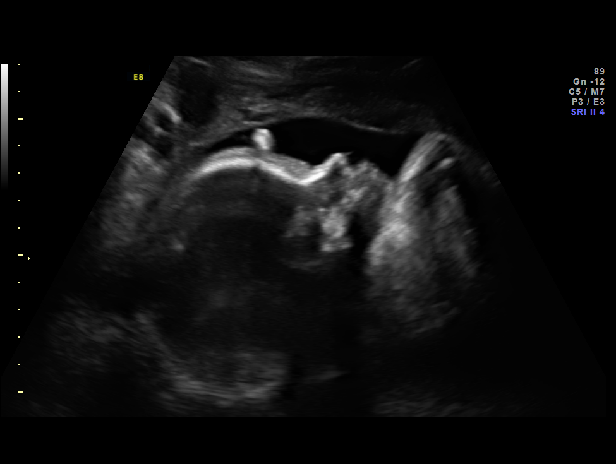
[im 57/85]
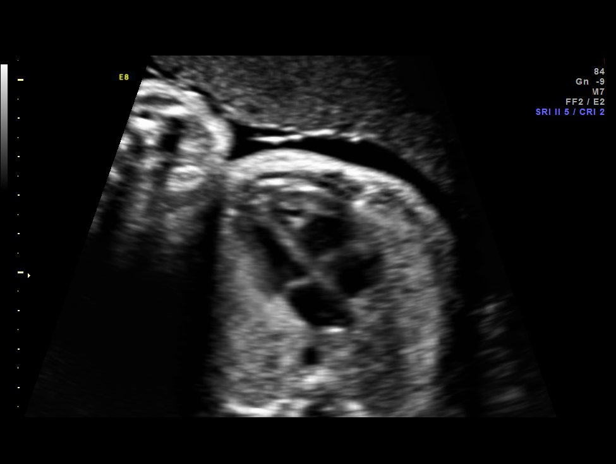
[im 63/85]
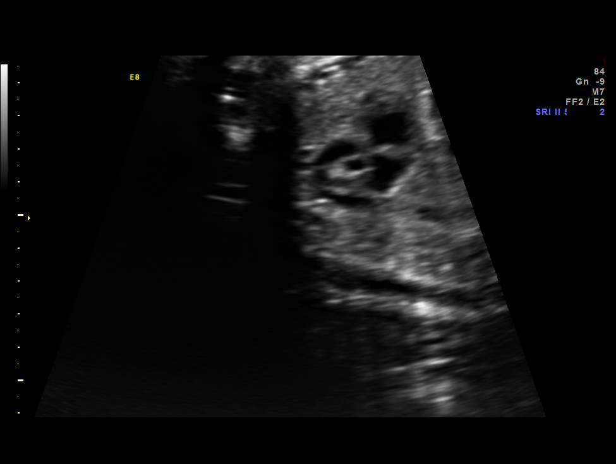
[im 66/85]
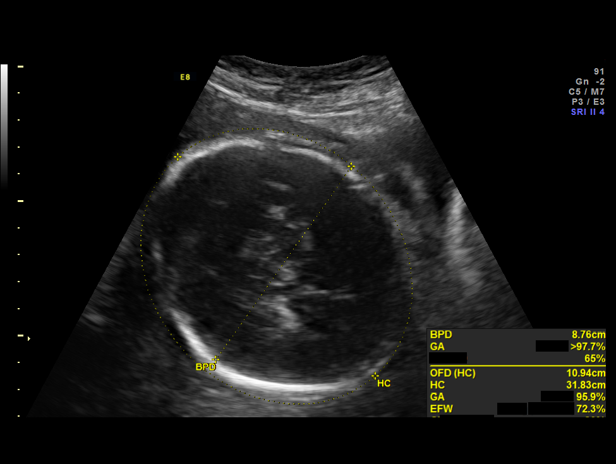
[im 72/85]
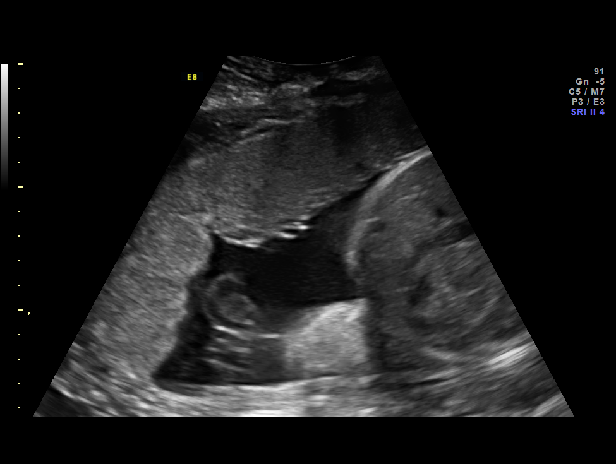
[im 78/85]
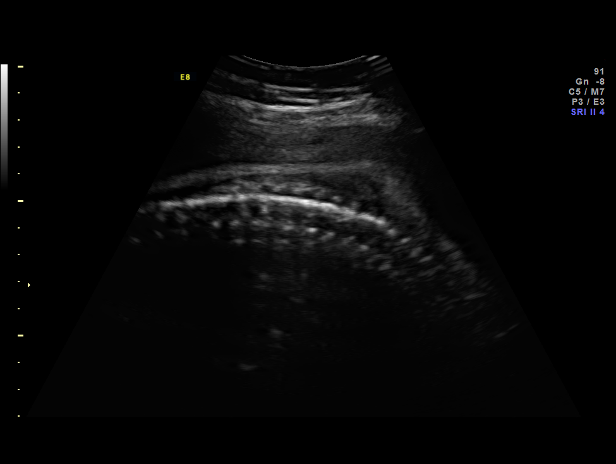
[im 85/85]
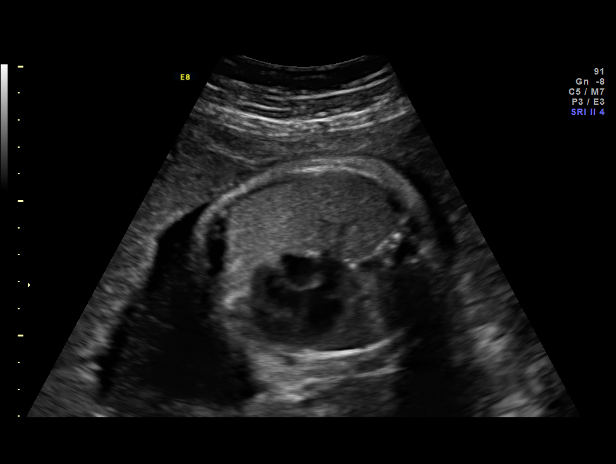

[16 of 28 positions shown; findings below may reference images not displayed]

FINDINGS: 1. Single intrauterine pregnancy.
 2. Estimated fetal weight is in the 75th%.
 3. Anterior placenta without evidence of previa. No evidence
 of retroplacental bleed seen.
 4. Normal amniotic fluid index.
 5. The limited anatomy survey is normal.
Recommendations

 1. Appropriate fetal growth.
 2. Normal limited anatomy survey:
 - anatomy survey limited by advanced gestational age and
 fetal position
 3. Vaginal bleeding:
 - patient had transvaginal cervical length done yesterday
 which was 9.7cm-9.8cm; which is slightly decreased but still
 within normal range
 - unclear etiology; patient reports nonpainful contractions
 - recommend discontinue magnesium sulfate as delivery
 does not appear imminent and do not recommend
 magnesium sulfate for fetal neuroprotection after 32 weeks
 - recommend complete course of Jim
 - recommend perform cervical exam now and after
 completion of Jim course and if no cervical
 change and no further vaginal bleeding may qualify for
 outpatient management
 - do not recommend bed rest; recommend no strenuous
 activity
 - recommend preterm labor/PPROM and bleeding precautions
 - recommend inpatient monitoring until at least 24-48 hours
 without bleeding with close fetal monitoring
 - slightly decreased cervical length- recommend cervical
 exam and close surveillance for evidence of preterm
 labor/PPROM
 4. History of PPROM and preterm delivery:
 - patient is on 17OH progesterone; recommend continue until
 36 weeks
 - discussed with this history there is an increased risk of
 preterm delivery
 5. Previous C section and myomectomy: plans delivery by
 repeat C section
 6. Advanced maternal age: previously counseled

 questions or concerns.

## 2013-12-29 ENCOUNTER — Other Ambulatory Visit: Payer: Self-pay | Admitting: Obstetrics and Gynecology

## 2013-12-30 LAB — CYTOLOGY - PAP

## 2014-04-26 ENCOUNTER — Encounter (HOSPITAL_COMMUNITY): Payer: Self-pay | Admitting: Obstetrics and Gynecology

## 2014-07-30 ENCOUNTER — Telehealth: Payer: Self-pay | Admitting: Internal Medicine

## 2014-07-30 NOTE — Telephone Encounter (Signed)
Spoke to patient   She went for run yesteray AM  3 mile in 30 min.   Later had episodes of palpitaions.  Last 8 to 10 sec  Increased to occurring every 3 min Last night felt while in bet Drank some water  This may have helped. Today a little better Does note that she is under increased stress  Moving this week/weekend  Recomm Sounds like she may not be getting enough fluids  Would increase If she wants I would be happy to see later this PM  She should try increaing fluid and salt inacke.

## 2014-07-30 NOTE — Telephone Encounter (Signed)
Patient c/o Palpitations:  High priority if patient c/o lightheadedness and shortness of breath.  1. How long have you been having palpitations? For the past 24 hours  2. Are you currently experiencing lightheadedness and shortness of breath? Sometimes lightheaded.. More dizzy than anything//No SOB  3. Have you checked your BP and heart rate? (document readings) No, checked it at the gym but she was working out.  4. Are you experiencing any other symptoms?   Comment: Having issues for the past 24 hours Aflutter// Heart pounds every 2-3  minutes for 8-10 seconds at a time. sometimes it makes her dizzy. even in her sleep.

## 2015-11-24 ENCOUNTER — Ambulatory Visit (INDEPENDENT_AMBULATORY_CARE_PROVIDER_SITE_OTHER): Payer: BLUE CROSS/BLUE SHIELD | Admitting: Allergy and Immunology

## 2015-11-24 ENCOUNTER — Encounter: Payer: Self-pay | Admitting: Allergy and Immunology

## 2015-11-24 VITALS — BP 104/68 | HR 72 | Temp 97.6°F | Resp 20 | Ht 64.6 in | Wt 132.0 lb

## 2015-11-24 DIAGNOSIS — K219 Gastro-esophageal reflux disease without esophagitis: Secondary | ICD-10-CM

## 2015-11-24 DIAGNOSIS — J4531 Mild persistent asthma with (acute) exacerbation: Secondary | ICD-10-CM | POA: Diagnosis not present

## 2015-11-24 DIAGNOSIS — H1010 Acute atopic conjunctivitis, unspecified eye: Secondary | ICD-10-CM

## 2015-11-24 DIAGNOSIS — J387 Other diseases of larynx: Secondary | ICD-10-CM | POA: Diagnosis not present

## 2015-11-24 DIAGNOSIS — J309 Allergic rhinitis, unspecified: Secondary | ICD-10-CM | POA: Diagnosis not present

## 2015-11-24 DIAGNOSIS — T781XXA Other adverse food reactions, not elsewhere classified, initial encounter: Secondary | ICD-10-CM

## 2015-11-24 MED ORDER — RANITIDINE HCL 300 MG PO TABS: 300.0000 mg | ORAL_TABLET | Freq: Every day | ORAL | Status: DC

## 2015-11-24 MED ORDER — EPINEPHRINE 0.3 MG/0.3ML IJ SOAJ: 0.3000 mg | Freq: Once | INTRAMUSCULAR | Status: DC

## 2015-11-24 MED ORDER — FLUTICASONE FUROATE 200 MCG/ACT IN AEPB: 1.0000 | INHALATION_SPRAY | Freq: Every day | RESPIRATORY_TRACT | Status: DC

## 2015-11-24 MED ORDER — ESOMEPRAZOLE MAGNESIUM 40 MG PO CPDR
40.0000 mg | DELAYED_RELEASE_CAPSULE | Freq: Every day | ORAL | Status: DC
Start: 2015-11-24 — End: 2024-02-10

## 2015-11-24 NOTE — Patient Instructions (Addendum)
  1. Allergen avoidance measures - house dust mite  2. Treat and prevent inflammation:   A. Arnuity 200 one inhalation 1 time per day  B. OTC Rhinocort one spray each nostril one time per day  3. Treat and prevent reflux:   A. minimize and attempt to taper off all chocolate and caffeine  B. Nexium 40 one tablet in a.m.  C. ranitidine 300 one tablet in PM  4. If needed:   A. Ventolin HFA 2 puffs every 4-6 hours  B. OTC antihistamine  C. Ladona Horns, MD/ER (specialty pharmacy)  5. Return to clinic in 3 weeks or earlier if problem  6. Review all chest x-rays 2017  7. Review skin test results from 1 decade ago

## 2015-11-24 NOTE — Progress Notes (Signed)
NEW PATIENT NOTE  Referring Provider: No ref. provider found Primary Provider: No PCP Per Patient Date of office visit: 11/24/2015    Subjective:   Chief Complaint:  [name] (DOB: 10-10-1975) is a 40 y.o. female with a chief complaint of New Patient (Initial Visit)  who presents to the clinic on 11/24/2015 with the following problems:  HPI: [name] presents to this clinic in evaluation of respiratory tract problems. She has a long history of allergic rhinoconjunctivitis for which she received immunotherapy back approximately a decade ago. She only uses this immunotherapy for several months. She has problems with nasal congestion and sneezing and itchy red watery eyes occurring on a perennial basis with spring and fall exacerbation especially following exposure to the outdoors and exposure to cats. When she was skin tested 10 years ago she was found to be allergic to all pollens, cockroach, dust mite, and dog.  This spring she became somewhat sick. She states that she had a "pneumonia" back in February and ever since that episode she's been having recurrent problems with cough. She has some occasional shortness of breath. She does not really notice a tremendous amount of expiratory wheezing but may have some inspiratory wheezing. She has throat clearing and a tickle in her throat and raspy voice. She's had 3 exacerbations of this issue each time being evaluated and treated with antibiotics and steroids. It should be noted that she can still run about 4 miles per day even while she is stuck in one of these episodes. Multiple chest x-rays have not identified any significant abnormality except for the "pneumonia" back in February.  She does not have any obvious reflux symptoms at this point in time. However, it should be noted that she did have reflux in the past requiring treatment with Nexium. When she became sick the other week with lots of coughing she did develop some heartburn and  started over-the-counter Zantac. She drinks one coffee per day and has chocolate on almost a daily basis.  She has a history of migraines with a pounding headache usually following exposure to perfume about 1 time per month for which she'll take ibuprofen.  [name] has an issue with eating certain foods. When eating carrots and strawberries and grapes and kiwi she gets mouth and throat itching. When she eats bananas she gets mouth and throat itching but much more intense with this food product.  Past Medical History  Diagnosis Date  . Asthma     inhaler Rx not filled-not used in 1 year, exercise/seasonal allergy induced  . GERD (gastroesophageal reflux disease)     occasional-uses zantac prn  . Headache(784.0)     rare migraine  . Syncope     last time fainted over 2 years ago  . Tachycardia   . Fibroid   . Dysrhythmia     palpitations- sinus tachycardia  . Anemia     Past Surgical History  Procedure Laterality Date  . Breast enhancement surgery  2007  . Cesarean section  2012  . Myomectomy  09/13/2011    Procedure: MYOMECTOMY;  Surgeon: Cyril Mourning, MD;  Location: [facility];  Service: Gynecology;  Laterality: N/A;  abdominal myomectomies  . Breast surgery    . Cesarean section N/A 10/28/2012    Procedure: CESAREAN SECTION;  Surgeon: Darlyn Chamber, MD;  Location: [facility];  Service: Obstetrics;  Laterality: N/A;  REPEAT EDC 11/18/12      Medication List  LEVAQUIN 750 MG tablet  Generic drug:  levofloxacin  Take 750 mg by mouth daily.     ranitidine 150 MG tablet  Commonly known as:  ZANTAC  Take 150 mg by mouth daily as needed for heartburn.     VENTOLIN HFA 108 (90 Base) MCG/ACT inhaler  Generic drug:  albuterol  Inhale 2 puffs into the lungs every 6 (six) hours as needed for wheezing or shortness of breath.     albuterol (2.5 MG/3ML) 0.083% nebulizer solution  Commonly known as:  PROVENTIL  Take 2.5 mg by nebulization every 6 (six) hours as needed for  wheezing or shortness of breath.     VIRTUSSIN A/C 100-10 MG/5ML syrup  Generic drug:  guaiFENesin-codeine  Take 10 mLs by mouth once.        Allergies  Allergen Reactions  . Bactrim Rash    Full body rash  . Cephalexin Rash    Full body rash  . Penicillins Rash    Full body rash  . Zithromax [Azithromycin] Rash    Full body rash    Review of systems negative except as noted in HPI / PMHx or noted below:  Review of Systems  Constitutional: Negative.   HENT: Negative.   Eyes: Negative.   Respiratory: Negative.   Cardiovascular: Negative.   Gastrointestinal: Negative.   Genitourinary: Negative.   Musculoskeletal: Negative.   Skin: Negative.   Neurological: Negative.   Endo/Heme/Allergies: Negative.   Psychiatric/Behavioral: Negative.     Family History  Problem Relation Age of Onset  . Heart disease Maternal Grandmother   . Stroke Maternal Grandmother   . Cancer Maternal Grandfather   . Asthma Mother     Social History   Social History  . Marital Status: Married    Spouse Name: N/A  . Number of Children: N/A  . Years of Education: N/A   Occupational History  . Not on file.   Social History Main Topics  . Smoking status: Never Smoker   . Smokeless tobacco: Never Used  . Alcohol Use: No     Comment: rare- none while pregnant  . Drug Use: No  . Sexual Activity: Not Currently    Birth Control/ Protection: None   Other Topics Concern  . Not on file   Social History Narrative    Environmental and Social history  Lives in a house with a dry environment, a dog located inside the household, carpeting in the bedroom, no plastic on the better pillow, and no smokers located inside the household.   Objective:   Filed Vitals:   11/24/15 1405  BP: 104/68  Pulse: 72  Temp: 97.6 F (36.4 C)  Resp: 20   Height: 5' 4.6" (164.1 cm) Weight: 132 lb (59.875 kg)  Physical Exam  Constitutional: She is well-developed, well-nourished, and in no distress.    Coughing, throat clearing, raspy voice, barking cough  HENT:  Head: Normocephalic. Head is without right periorbital erythema and without left periorbital erythema.  Right Ear: Tympanic membrane, external ear and ear canal normal.  Left Ear: Tympanic membrane, external ear and ear canal normal.  Nose: Mucosal edema present. No rhinorrhea.  Mouth/Throat: Oropharynx is clear and moist and mucous membranes are normal. No oropharyngeal exudate.  Eyes: Conjunctivae and lids are normal. Pupils are equal, round, and reactive to light.  Neck: Trachea normal. No tracheal deviation present. No thyromegaly present.  Cardiovascular: Normal rate, regular rhythm, S1 normal, S2 normal and normal heart sounds.   No murmur heard.  Pulmonary/Chest: Effort normal. No stridor. No tachypnea. No respiratory distress. She has no wheezes. She has no rales. She exhibits no tenderness.  Abdominal: Soft. She exhibits no distension and no mass. There is no hepatosplenomegaly. There is no tenderness. There is no rebound and no guarding.  Musculoskeletal: She exhibits no edema or tenderness.  Lymphadenopathy:       Head (right side): No tonsillar adenopathy present.       Head (left side): No tonsillar adenopathy present.    She has no cervical adenopathy.    She has no axillary adenopathy.  Neurological: She is alert. Gait normal.  Skin: No rash noted. She is not diaphoretic. No erythema. No pallor. Nails show no clubbing.  Psychiatric: Mood and affect normal.     Diagnostics: Allergy skin tests were not performed.   Spirometry was performed and demonstrated an FEV1 of 2.99 @ 96 % of predicted. Following the administration of nebulized albuterol her FEV1 rose to 3.40 which was an increase in the FEV1 of 14%.  The patient had an Asthma Control Test with the following results:  .     Assessment and Plan:    1. Asthma, not well controlled, mild persistent, with acute exacerbation   2. Allergic  rhinoconjunctivitis   3. LPRD (laryngopharyngeal reflux disease)   4. Oral allergy syndrome, initial encounter     1. Allergen avoidance measures - house dust mite  2. Treat and prevent inflammation:   A. Arnuity 200 one inhalation 1 time per day  B. OTC Rhinocort one spray each nostril one time per day  3. Treat and prevent reflux:   A. minimize and attempt to taper off all chocolate and caffeine  B. Nexium 40 one tablet in a.m.  C. ranitidine 300 one tablet in PM  4. If needed:   A. Ventolin HFA 2 puffs every 4-6 hours  B. OTC antihistamine  C. Ladona Horns, MD/ER (specialty pharmacy)  5. Return to clinic in 3 weeks or earlier if problem  6. Review all chest x-rays 2017  7. Review skin test results from 1 decade ago   [name] appears to have an irritated and inflamed respiratory tract that is probably a combination of atopic respiratory disease and reflux-induced respiratory disease for which I'll have her use a combination of therapy including inhaled steroids and nasal steroids and aggressive therapy directed against reflux for the next several weeks. She will contact me should she have significant problems in the face of this therapy but otherwise I'll see her back in this clinic in 3 weeks.  Allena Katz, MD [place]

## 2015-11-30 ENCOUNTER — Encounter: Payer: Self-pay | Admitting: *Deleted

## 2015-12-07 ENCOUNTER — Ambulatory Visit: Payer: Self-pay | Admitting: Allergy and Immunology

## 2015-12-15 ENCOUNTER — Encounter: Payer: Self-pay | Admitting: Allergy and Immunology

## 2015-12-15 ENCOUNTER — Ambulatory Visit (INDEPENDENT_AMBULATORY_CARE_PROVIDER_SITE_OTHER): Payer: BLUE CROSS/BLUE SHIELD | Admitting: Allergy and Immunology

## 2015-12-15 VITALS — BP 108/80 | HR 76 | Resp 20

## 2015-12-15 DIAGNOSIS — J387 Other diseases of larynx: Secondary | ICD-10-CM | POA: Diagnosis not present

## 2015-12-15 DIAGNOSIS — J309 Allergic rhinitis, unspecified: Secondary | ICD-10-CM

## 2015-12-15 DIAGNOSIS — H1010 Acute atopic conjunctivitis, unspecified eye: Secondary | ICD-10-CM

## 2015-12-15 DIAGNOSIS — T781XXA Other adverse food reactions, not elsewhere classified, initial encounter: Secondary | ICD-10-CM | POA: Diagnosis not present

## 2015-12-15 DIAGNOSIS — J4531 Mild persistent asthma with (acute) exacerbation: Secondary | ICD-10-CM

## 2015-12-15 DIAGNOSIS — K219 Gastro-esophageal reflux disease without esophagitis: Secondary | ICD-10-CM

## 2015-12-15 NOTE — Patient Instructions (Addendum)
  1. Replace throat clearing with swallowing maneuver  2. Continue to Treat and prevent inflammation:   A. Arnuity 200 one inhalation 1 time per day  B. OTC Rhinocort one spray each nostril one time per day  3. Continue to Treat and prevent reflux:   A. minimize and attempt to taper off all chocolate and caffeine  B. Nexium 40 one tablet in a.m.  C. ranitidine 300 one tablet in PM  4. If needed:   A. Ventolin HFA 2 puffs every 4-6 hours  B. OTC antihistamine  C. Auvi-Q, Benedryl, MD/ER   5. Return to clinic in 9 weeks or earlier if problem

## 2015-12-15 NOTE — Progress Notes (Signed)
Follow-up Note  Referring Provider: No ref. provider found Primary Provider: No PCP Per Patient Date of Office Visit: 12/15/2015  Subjective:   [name] (DOB: [birth year]) is a 40 y.o. female who returns to the [facility] on 12/15/2015 in re-evaluation of the following:  HPI: [name] returns to this clinic in reevaluation of her respiratory tract problems. Utilizing medical therapy prescribed on 11/25/2015  this is the first time that she has no cough since February 2017. Her throat is doing much better although she still has a little bit of throat clearing especially over the course the past week or so. She is able to exercise without any difficulty and does not use her short-acting bronchodilator. She's not having any reflux symptoms. She still continues to drink one coffee per day.       Medication List           AUVI-Q 0.3 mg/0.3 mL Soaj injection  Generic drug:  EPINEPHrine  Inject 0.3 mg into the muscle once.     EPINEPHrine 0.3 mg/0.3 mL Soaj injection  Commonly known as:  EPI-PEN  Inject 0.3 mLs (0.3 mg total) into the muscle once.     esomeprazole 40 MG capsule  Commonly known as:  NEXIUM  Take 1 capsule (40 mg total) by mouth daily at 12 noon.     Fluticasone Furoate 200 MCG/ACT Aepb  Commonly known as:  ARNUITY ELLIPTA  Inhale 1 puff into the lungs daily.     JUICE PLUS FIBRE PO  Take 1 Dose by mouth daily.     loratadine 10 MG tablet  Commonly known as:  CLARITIN  Take 10 mg by mouth daily.     ranitidine 300 MG tablet  Commonly known as:  ZANTAC  Take 1 tablet (300 mg total) by mouth at bedtime.     VENTOLIN HFA 108 (90 Base) MCG/ACT inhaler  Generic drug:  albuterol  Inhale 2 puffs into the lungs every 6 (six) hours as needed for wheezing or shortness of breath.     albuterol (2.5 MG/3ML) 0.083% nebulizer solution  Commonly known as:  PROVENTIL  Take 2.5 mg by nebulization every 6 (six) hours as needed for wheezing or  shortness of breath.        Past Medical History  Diagnosis Date  . Asthma     inhaler Rx not filled-not used in 1 year, exercise/seasonal allergy induced  . GERD (gastroesophageal reflux disease)     occasional-uses zantac prn  . Headache(784.0)     rare migraine  . Syncope     last time fainted over 2 years ago  . Tachycardia   . Fibroid   . Dysrhythmia     palpitations- sinus tachycardia  . Anemia     Past Surgical History  Procedure Laterality Date  . Breast enhancement surgery  2007  . Cesarean section  2012  . Myomectomy  09/13/2011    Procedure: MYOMECTOMY;  Surgeon: Cyril Mourning, MD;  Location: [facility];  Service: Gynecology;  Laterality: N/A;  abdominal myomectomies  . Breast surgery    . Cesarean section N/A 10/28/2012    Procedure: CESAREAN SECTION;  Surgeon: Darlyn Chamber, MD;  Location: [facility];  Service: Obstetrics;  Laterality: N/A;  REPEAT EDC 11/18/12    Allergies  Allergen Reactions  . Bactrim Rash    Full body rash  . Cephalexin Rash    Full body rash  . Penicillins Rash  Full body rash  . Zithromax [Azithromycin] Rash    Full body rash    Review of systems negative except as noted in HPI / PMHx or noted below:  Review of Systems  Constitutional: Negative.   HENT: Negative.   Eyes: Negative.   Respiratory: Negative.   Cardiovascular: Negative.   Gastrointestinal: Negative.   Genitourinary: Negative.   Musculoskeletal: Negative.   Skin: Negative.   Neurological: Negative.   Endo/Heme/Allergies: Negative.   Psychiatric/Behavioral: Negative.      Objective:   Filed Vitals:   12/15/15 1045  BP: 108/80  Pulse: 76  Resp: 20          Physical Exam  Constitutional: She is well-developed, well-nourished, and in no distress.  Throat clearing  HENT:  Head: Normocephalic.  Right Ear: Tympanic membrane, external ear and ear canal normal.  Left Ear: Tympanic membrane, external ear and ear canal normal.  Nose: Nose normal. No  mucosal edema or rhinorrhea.  Mouth/Throat: Uvula is midline, oropharynx is clear and moist and mucous membranes are normal. No oropharyngeal exudate.  Eyes: Conjunctivae are normal.  Neck: Trachea normal. No tracheal tenderness present. No tracheal deviation present. No thyromegaly present.  Cardiovascular: Normal rate, regular rhythm, S1 normal, S2 normal and normal heart sounds.   No murmur heard. Pulmonary/Chest: Breath sounds normal. No stridor. No respiratory distress. She has no wheezes. She has no rales.  Musculoskeletal: She exhibits no edema.  Lymphadenopathy:       Head (right side): No tonsillar adenopathy present.       Head (left side): No tonsillar adenopathy present.    She has no cervical adenopathy.  Neurological: She is alert. Gait normal.  Skin: No rash noted. She is not diaphoretic. No erythema. Nails show no clubbing.  Psychiatric: Mood and affect normal.    Diagnostics:    Spirometry was performed and demonstrated an FEV1 of 3.27 at 104 % of predicted.  The patient had an Asthma Control Test with the following results: ACT Total Score: 24.    Assessment and Plan:   1. Asthma, not well controlled, mild persistent, with acute exacerbation   2. Allergic rhinoconjunctivitis   3. LPRD (laryngopharyngeal reflux disease)   4. Oral allergy syndrome, initial encounter     1. Replace throat clearing with swallowing maneuver  2. Continue to Treat and prevent inflammation:   A. Arnuity 200 one inhalation 1 time per day  B. OTC Rhinocort one spray each nostril one time per day  3. Continue to Treat and prevent reflux:   A. minimize and attempt to taper off all chocolate and caffeine  B. Nexium 40 one tablet in a.m.  C. ranitidine 300 one tablet in PM  4. If needed:   A. Ventolin HFA 2 puffs every 4-6 hours  B. OTC antihistamine  C. Auvi-Q, Benedryl, MD/ER   5. Return to clinic in 9 weeks or earlier if problem   It appears as though Carleta has had a very  good response to medical therapy and I'm going to continue to have her use this form of therapy for a full 12 weeks and thus she will return to this clinic in 9 weeks. I did ask her to replace her throat clearing maneuver with a swallowing maneuver as she's probably creating some degree of laryngeal irritation just based upon the fact that she clears her throat. She will continue to use anti-inflammatory agents for her respiratory tract and reflux medications as noted above. I'll see her back  in this clinic in 9 weeks or earlier if there is a problem  Allena Katz, MD [place]

## 2016-02-16 ENCOUNTER — Ambulatory Visit: Payer: BLUE CROSS/BLUE SHIELD | Admitting: Allergy and Immunology

## 2016-03-12 ENCOUNTER — Ambulatory Visit: Payer: BLUE CROSS/BLUE SHIELD | Admitting: Allergy and Immunology

## 2016-04-09 ENCOUNTER — Encounter: Payer: Self-pay | Admitting: Allergy and Immunology

## 2016-04-09 ENCOUNTER — Ambulatory Visit (INDEPENDENT_AMBULATORY_CARE_PROVIDER_SITE_OTHER): Payer: BLUE CROSS/BLUE SHIELD | Admitting: Allergy and Immunology

## 2016-04-09 VITALS — BP 106/78 | HR 64 | Resp 20

## 2016-04-09 DIAGNOSIS — J4530 Mild persistent asthma, uncomplicated: Secondary | ICD-10-CM

## 2016-04-09 DIAGNOSIS — J309 Allergic rhinitis, unspecified: Secondary | ICD-10-CM

## 2016-04-09 DIAGNOSIS — H1010 Acute atopic conjunctivitis, unspecified eye: Secondary | ICD-10-CM | POA: Diagnosis not present

## 2016-04-09 DIAGNOSIS — K219 Gastro-esophageal reflux disease without esophagitis: Secondary | ICD-10-CM | POA: Diagnosis not present

## 2016-04-09 DIAGNOSIS — T781XXA Other adverse food reactions, not elsewhere classified, initial encounter: Secondary | ICD-10-CM | POA: Diagnosis not present

## 2016-04-09 MED ORDER — MOMETASONE FUROATE 220 MCG/INH IN AEPB: 2.0000 | INHALATION_SPRAY | Freq: Every day | RESPIRATORY_TRACT | 3 refills | Status: DC

## 2016-04-09 MED ORDER — MOMETASONE FUROATE 200 MCG/ACT IN AERO: 2.0000 | INHALATION_SPRAY | Freq: Every day | RESPIRATORY_TRACT | 3 refills | Status: DC

## 2016-04-09 NOTE — Progress Notes (Signed)
Follow-up Note  Referring Provider: No ref. provider found Primary Provider: No PCP Per Patient Date of Office Visit: 04/09/2016  Subjective:   [name] (DOB: 02/07/1976) is a 40 y.o. female who returns to the [facility] on 04/09/2016 in re-evaluation of the following:  HPI: [name] returns to this clinic in reevaluation of her LPR, asthma, and allergic rhinitis. I last saw her in his clinic in June 2017  During the interval she continues to do very well regarding her difficult to control cough and throat issues and nasal issues. However, she still continues to have some problems with reflux with burning in her chest and discomfort in her chest while utilizing her therapy directed against reflux. She still drinks 1 cup of coffee per day.   She has no need to use a bronchodilator and can exercise without any difficulty and has not required an antibiotic or a systemic steroid to treat any type of respiratory tract flareups since I've seen her in his clinic last. She does have some slight coughing and choking when she uses her Arnuity inhaler.  She has not had any type of reaction to eating specific foods. She remains away from all foods associated with her oral allergy syndrome. She does have a epinephrine device.    Medication List      AUVI-Q 0.3 mg/0.3 mL Soaj injection Generic drug:  EPINEPHrine Inject 0.3 mg into the muscle once.   esomeprazole 40 MG capsule Commonly known as:  NEXIUM Take 1 capsule (40 mg total) by mouth daily at 12 noon.   Fluticasone Furoate 200 MCG/ACT Aepb Commonly known as:  ARNUITY ELLIPTA Inhale 1 puff into the lungs daily.   JUICE PLUS FIBRE PO Take 1 Dose by mouth daily.   loratadine 10 MG tablet Commonly known as:  CLARITIN Take 10 mg by mouth daily.   ranitidine 300 MG tablet Commonly known as:  ZANTAC Take 1 tablet (300 mg total) by mouth at bedtime.   VENTOLIN HFA 108 (90 Base) MCG/ACT inhaler Generic drug:   albuterol Inhale 2 puffs into the lungs every 6 (six) hours as needed for wheezing or shortness of breath.   albuterol (2.5 MG/3ML) 0.083% nebulizer solution Commonly known as:  PROVENTIL Take 2.5 mg by nebulization every 6 (six) hours as needed for wheezing or shortness of breath.       Past Medical History:  Diagnosis Date  . Anemia   . Asthma    inhaler Rx not filled-not used in 1 year, exercise/seasonal allergy induced  . Dysrhythmia    palpitations- sinus tachycardia  . Fibroid   . GERD (gastroesophageal reflux disease)    occasional-uses zantac prn  . Headache(784.0)    rare migraine  . Syncope    last time fainted over 2 years ago  . Tachycardia     Past Surgical History:  Procedure Laterality Date  . BREAST ENHANCEMENT SURGERY  2007  . BREAST SURGERY    . CESAREAN SECTION  2012  . CESAREAN SECTION N/A 10/28/2012   Procedure: CESAREAN SECTION;  Surgeon: Darlyn Chamber, MD;  Location: [facility];  Service: Obstetrics;  Laterality: N/A;  REPEAT EDC 11/18/12  . MYOMECTOMY  09/13/2011   Procedure: MYOMECTOMY;  Surgeon: Cyril Mourning, MD;  Location: [facility];  Service: Gynecology;  Laterality: N/A;  abdominal myomectomies    Allergies  Allergen Reactions  . Bactrim Rash    Full body rash  . Cephalexin Rash  Full body rash  . Penicillins Rash    Full body rash  . Zithromax [Azithromycin] Rash    Full body rash    Review of systems negative except as noted in HPI / PMHx or noted below:  Review of Systems  Constitutional: Negative.   HENT: Negative.   Eyes: Negative.   Respiratory: Negative.   Cardiovascular: Negative.   Gastrointestinal: Negative.   Genitourinary: Negative.   Musculoskeletal: Negative.   Skin: Negative.   Neurological: Negative.   Endo/Heme/Allergies: Negative.   Psychiatric/Behavioral: Negative.      Objective:   Vitals:   04/09/16 1108  BP: 106/78  Pulse: 64  Resp: 20          Physical Exam  Constitutional: She is  well-developed, well-nourished, and in no distress.  HENT:  Head: Normocephalic.  Right Ear: Tympanic membrane, external ear and ear canal normal.  Left Ear: Tympanic membrane, external ear and ear canal normal.  Nose: Nose normal. No mucosal edema or rhinorrhea.  Mouth/Throat: Uvula is midline, oropharynx is clear and moist and mucous membranes are normal. No oropharyngeal exudate.  Eyes: Conjunctivae are normal.  Neck: Trachea normal. No tracheal tenderness present. No tracheal deviation present. No thyromegaly present.  Cardiovascular: Normal rate, regular rhythm, S1 normal, S2 normal and normal heart sounds.   No murmur heard. Pulmonary/Chest: Breath sounds normal. No stridor. No respiratory distress. She has no wheezes. She has no rales.  Musculoskeletal: She exhibits no edema.  Lymphadenopathy:       Head (right side): No tonsillar adenopathy present.       Head (left side): No tonsillar adenopathy present.    She has no cervical adenopathy.  Neurological: She is alert. Gait normal.  Skin: No rash noted. She is not diaphoretic. No erythema. Nails show no clubbing.  Psychiatric: Mood and affect normal.    Diagnostics:    Spirometry was performed and demonstrated an FEV1 of 3.17 at 101 % of predicted.  The patient had an Asthma Control Test with the following results: ACT Total Score: 24.    Assessment and Plan:   1. Asthma, well controlled, mild persistent   2. Allergic rhinoconjunctivitis   3. LPRD (laryngopharyngeal reflux disease)   4. Oral allergy syndrome, initial encounter     1. Continue to Replace throat clearing with swallowing maneuver  2. Continue to Treat and prevent inflammation:   A. Change Arnuity to Asmanex 200 HFA two inhalations in AM with spacer  B. OTC Rhinocort one spray each nostril 3-7 times per week  3. Continue to Treat and prevent reflux:   A. minimize and attempt to taper off all chocolate and caffeine  B. Nexium 40 one tablet in a.m.  C.  ranitidine 300 one tablet in PM  4. If needed:   A. Ventolin HFA 2 puffs every 4-6 hours  B. OTC antihistamine  C. Auvi-Q, Benedryl, MD/ER   5. Visit with Gastroenterologist for upper endoscopy  6. Obtain fall flu vaccine  7. Return December 2017 or earlier if problem.    [name] is doing better but there are 2 active issues that need to be addressed today. The first is the fact that her Arnuity inhaler is making her cough and choke. This is probably because of the dry powder formulation and I will now give her a HFA formulation using Asmanex. We will only have her use this one time per day at this point in time. The second issue is the fact that she still has  reflux symptoms in the face of using Nexium and ranitidine and I'm going to have a gastroenterologist examine her upper digestive tract with an endoscopy as soon as we can get that arranged. If she does well I will see her back in this clinic in December or earlier if there is a problem.  Allena Katz, MD [place]

## 2016-04-09 NOTE — Patient Instructions (Signed)
  1. Continue to Replace throat clearing with swallowing maneuver  2. Continue to Treat and prevent inflammation:   A. Change Arnuity to Asmanex 200 HFA two inhalations in AM with spacer  B. OTC Rhinocort one spray each nostril 3-7 times per week  3. Continue to Treat and prevent reflux:   A. minimize and attempt to taper off all chocolate and caffeine  B. Nexium 40 one tablet in a.m.  C. ranitidine 300 one tablet in PM  4. If needed:   A. Ventolin HFA 2 puffs every 4-6 hours  B. OTC antihistamine  C. Auvi-Q, Benedryl, MD/ER   5. Visit with Gastroenterologist for upper endoscopy  6. Obtain fall flu vaccine  7. Return December 2017 or earlier if problem.

## 2016-10-03 ENCOUNTER — Other Ambulatory Visit: Payer: Self-pay | Admitting: *Deleted

## 2016-10-03 MED ORDER — ALBUTEROL SULFATE HFA 108 (90 BASE) MCG/ACT IN AERS: INHALATION_SPRAY | RESPIRATORY_TRACT | 0 refills | Status: DC

## 2016-10-08 ENCOUNTER — Encounter: Payer: Self-pay | Admitting: Allergy and Immunology

## 2016-10-08 ENCOUNTER — Ambulatory Visit (INDEPENDENT_AMBULATORY_CARE_PROVIDER_SITE_OTHER): Payer: BLUE CROSS/BLUE SHIELD | Admitting: Allergy and Immunology

## 2016-10-08 VITALS — BP 108/82 | HR 72 | Resp 16

## 2016-10-08 DIAGNOSIS — J4530 Mild persistent asthma, uncomplicated: Secondary | ICD-10-CM

## 2016-10-08 DIAGNOSIS — K219 Gastro-esophageal reflux disease without esophagitis: Secondary | ICD-10-CM | POA: Diagnosis not present

## 2016-10-08 DIAGNOSIS — T781XXA Other adverse food reactions, not elsewhere classified, initial encounter: Secondary | ICD-10-CM

## 2016-10-08 DIAGNOSIS — H1010 Acute atopic conjunctivitis, unspecified eye: Secondary | ICD-10-CM

## 2016-10-08 DIAGNOSIS — J309 Allergic rhinitis, unspecified: Secondary | ICD-10-CM | POA: Diagnosis not present

## 2016-10-08 MED ORDER — FLUTICASONE PROPIONATE HFA 44 MCG/ACT IN AERO: 2.0000 | INHALATION_SPRAY | Freq: Two times a day (BID) | RESPIRATORY_TRACT | 5 refills | Status: DC

## 2016-10-08 NOTE — Patient Instructions (Addendum)
  1. Continue to Replace throat clearing with swallowing maneuver  2. Continue to Treat and prevent inflammation:   A. Change Asmanex 200 to Flovent 44 2 inhalations 1-2 times per day  B. OTC Rhinocort one spray each nostril 3-7 times per week  3. Continue to Treat and prevent reflux:   A. minimize and attempt to taper off all chocolate and caffeine  B. Nexium 40 one tablet in a.m.  C. ranitidine 300 one tablet in PM  4. If needed:   A. Ventolin HFA 2 puffs every 4-6 hours  B. OTC antihistamine  C. Auvi-Q, Benedryl, MD/ER   5. Return 6 months or earlier if problem.

## 2016-10-08 NOTE — Progress Notes (Signed)
Follow-up Note  Referring Provider: No ref. provider found Primary Provider: No PCP Per Patient Date of Office Visit: 10/08/2016  Subjective:   [name] (DOB: [birth year]) is a 41 y.o. female who [facility]2016 in re-evaluation of the following:  HPI: [name] presents to this clinic in reevaluation of her asthma and allergic rhinitis and LPR. I last saw her in this clinic October 2017.  During the interval she has done very well but 2 weeks ago while at the beach she developed problems with sneezing and coughing and needed to use her bronchodilator especially while running. She started Xyzal and she is much better. Other than this single event while at the beach she has really done very well and has not required a bronchodilator and can exercise without any difficulty and has not required any systemic steroids or antibiotics to treat any type of respiratory tract problem while consistently using low-dose inhaled steroid and a nasal steroid and aggressive therapy against reflux including a proton pump inhibitor and an H2 receptor blocker. She still continues to have coffee in the morning.  Her insurance company will no longer cover Asmanex.  She has not had any allergic reactions. She does not eat any foods that give rise to her oral allergy syndrome.  She did not receive the flu vaccine this year.  Allergies as of 10/08/2016      Reactions   Bactrim Rash   Full body rash   Cephalexin Rash   Full body rash   Penicillins Rash   Full body rash   Zithromax [azithromycin] Rash   Full body rash      Medication List      albuterol 108 (90 Base) MCG/ACT inhaler Commonly known as:  VENTOLIN HFA Inhale two puffs every 4-6 hours if needed for cough or wheeze   albuterol (2.5 MG/3ML) 0.083% nebulizer solution Commonly known as:  PROVENTIL Take 2.5 mg by nebulization every 6 (six) hours as needed for wheezing or shortness of breath.     AUVI-Q 0.3 mg/0.3 mL Soaj injection Generic drug:  EPINEPHrine Inject 0.3 mg into the muscle once.   esomeprazole 40 MG capsule Commonly known as:  NEXIUM Take 1 capsule (40 mg total) by mouth daily at 12 noon.   Fluticasone Furoate 200 MCG/ACT Aepb Commonly known as:  ARNUITY ELLIPTA Inhale 1 puff into the lungs daily.   JUICE PLUS FIBRE PO Take 1 Dose by mouth daily.   loratadine 10 MG tablet Commonly known as:  CLARITIN Take 10 mg by mouth daily.   Mometasone Furoate 200 MCG/ACT Aero Commonly known as:  ASMANEX HFA Inhale 2 puffs into the lungs daily.   ranitidine 300 MG tablet Commonly known as:  ZANTAC Take 1 tablet (300 mg total) by mouth at bedtime.       Past Medical History:  Diagnosis Date  . Anemia   . Asthma    inhaler Rx not filled-not used in 1 year, exercise/seasonal allergy induced  . Dysrhythmia    palpitations- sinus tachycardia  . Fibroid   . GERD (gastroesophageal reflux disease)    occasional-uses zantac prn  . Headache(784.0)    rare migraine  . Syncope    last time fainted over 2 years ago  . Tachycardia     Past Surgical History:  Procedure Laterality Date  . BREAST ENHANCEMENT SURGERY  2007  . BREAST SURGERY    . CESAREAN SECTION  2012  .  CESAREAN SECTION N/A 10/28/2012   Procedure: CESAREAN SECTION;  Surgeon: Darlyn Chamber, MD;  Location: [facility];  Service: Obstetrics;  Laterality: N/A;  REPEAT EDC 11/18/12  . MYOMECTOMY  09/13/2011   Procedure: MYOMECTOMY;  Surgeon: Cyril Mourning, MD;  Location: [facility];  Service: Gynecology;  Laterality: N/A;  abdominal myomectomies    Review of systems negative except as noted in HPI / PMHx or noted below:  Review of Systems  Constitutional: Negative.   HENT: Negative.   Eyes: Negative.   Respiratory: Negative.   Cardiovascular: Negative.   Gastrointestinal: Negative.   Genitourinary: Negative.   Musculoskeletal: Negative.   Skin: Negative.   Neurological: Negative.    Endo/Heme/Allergies: Negative.   Psychiatric/Behavioral: Negative.      Objective:   Vitals:   10/08/16 0840  BP: 108/82  Pulse: 72  Resp: 16          Physical Exam  Constitutional: She is well-developed, well-nourished, and in no distress.  HENT:  Head: Normocephalic.  Right Ear: Tympanic membrane, external ear and ear canal normal.  Left Ear: Tympanic membrane, external ear and ear canal normal.  Nose: Nose normal. No mucosal edema or rhinorrhea.  Mouth/Throat: Uvula is midline, oropharynx is clear and moist and mucous membranes are normal. No oropharyngeal exudate.  Eyes: Conjunctivae are normal.  Neck: Trachea normal. No tracheal tenderness present. No tracheal deviation present. No thyromegaly present.  Cardiovascular: Normal rate, regular rhythm, S1 normal, S2 normal and normal heart sounds.   No murmur heard. Pulmonary/Chest: Breath sounds normal. No stridor. No respiratory distress. She has no wheezes. She has no rales.  Musculoskeletal: She exhibits no edema.  Lymphadenopathy:       Head (right side): No tonsillar adenopathy present.       Head (left side): No tonsillar adenopathy present.    She has no cervical adenopathy.  Neurological: She is alert. Gait normal.  Skin: No rash noted. She is not diaphoretic. No erythema. Nails show no clubbing.  Psychiatric: Mood and affect normal.    Diagnostics:    Spirometry was performed and demonstrated an FEV1 of 2.96 at 95 % of predicted.  The patient had an Asthma Control Test with the following results: ACT Total Score: 19.    Assessment and Plan:   1. Asthma, well controlled, mild persistent   2. Allergic rhinoconjunctivitis   3. LPRD (laryngopharyngeal reflux disease)   4. Oral allergy syndrome, initial encounter     1. Continue to Replace throat clearing with swallowing maneuver  2. Continue to Treat and prevent inflammation:   A. Change Asmanex 200 to Flovent 44 2 inhalations 1-2 times per day  B.  OTC Rhinocort one spray each nostril 3-7 times per week  3. Continue to Treat and prevent reflux:   A. minimize and attempt to taper off all chocolate and caffeine  B. Nexium 40 one tablet in a.m.  C. ranitidine 300 one tablet in PM  4. If needed:   A. Ventolin HFA 2 puffs every 4-6 hours  B. OTC antihistamine  C. Auvi-Q, Benedryl, MD/ER   5. Return 6 months or earlier if problem.    Janine appears to be doing relatively well although still has some throat clearing and I once again talked with her today about the need to convert over to a swallowing maneuver to replace her throat clearing maneuver. Her insurance company is giving her a hard time about filling her inhaled steroid and we'll try low-dose Flovent at this  point and she will continue to use a nasal steroid and aggressive therapy against reflux as noted above. If she does well I will see her back in this clinic in 6 months or earlier if there is a problem.  Allena Katz, MD [facility] [place]

## 2017-05-09 ENCOUNTER — Other Ambulatory Visit: Payer: Self-pay | Admitting: Obstetrics and Gynecology

## 2017-06-10 ENCOUNTER — Other Ambulatory Visit: Payer: Self-pay | Admitting: Allergy and Immunology

## 2017-06-10 NOTE — Telephone Encounter (Signed)
Courtesy refill  

## 2017-08-12 ENCOUNTER — Other Ambulatory Visit: Payer: Self-pay | Admitting: Allergy and Immunology

## 2017-10-29 ENCOUNTER — Other Ambulatory Visit: Payer: Self-pay | Admitting: Allergy and Immunology

## 2018-01-17 ENCOUNTER — Other Ambulatory Visit: Payer: Self-pay | Admitting: Allergy and Immunology

## 2024-02-07 ENCOUNTER — Encounter: Payer: Self-pay | Admitting: Gastroenterology

## 2024-02-10 ENCOUNTER — Ambulatory Visit (AMBULATORY_SURGERY_CENTER)

## 2024-02-10 VITALS — Ht 64.5 in | Wt 135.4 lb

## 2024-02-10 DIAGNOSIS — Z1211 Encounter for screening for malignant neoplasm of colon: Secondary | ICD-10-CM

## 2024-02-10 MED ORDER — NA SULFATE-K SULFATE-MG SULF 17.5-3.13-1.6 GM/177ML PO SOLN: 1.0000 | Freq: Once | ORAL | 0 refills | Status: AC

## 2024-02-10 NOTE — Progress Notes (Signed)
 No egg or soy allergy known to patient  No issues known to pt with past sedation with any surgeries or procedures Patient denies ever being told they had issues or difficulty with intubation  No FH of Malignant Hyperthermia Pt is not on diet pills Pt is not on  home 02  Pt is not on blood thinners  Pt denies issues with constipation  No A fib or A flutter Have any cardiac testing pending-- no  LOA: independent  Prep: suprep    PV completed with patient. Prep instructions sent via mychart and hard copy given at apt

## 2024-02-11 ENCOUNTER — Encounter: Payer: Self-pay | Admitting: Gastroenterology

## 2024-02-21 ENCOUNTER — Encounter: Payer: Self-pay | Admitting: Gastroenterology

## 2024-02-21 ENCOUNTER — Ambulatory Visit (AMBULATORY_SURGERY_CENTER): Payer: PRIVATE HEALTH INSURANCE | Admitting: Gastroenterology

## 2024-02-21 VITALS — BP 113/68 | HR 72 | Temp 97.3°F | Resp 14 | Ht 64.5 in | Wt 135.4 lb

## 2024-02-21 DIAGNOSIS — K648 Other hemorrhoids: Secondary | ICD-10-CM

## 2024-02-21 DIAGNOSIS — Z1211 Encounter for screening for malignant neoplasm of colon: Secondary | ICD-10-CM | POA: Diagnosis present

## 2024-02-21 DIAGNOSIS — K644 Residual hemorrhoidal skin tags: Secondary | ICD-10-CM

## 2024-02-21 MED ORDER — SODIUM CHLORIDE 0.9 % IV SOLN: 500.0000 mL | Freq: Once | INTRAVENOUS | Status: DC

## 2024-02-21 NOTE — Patient Instructions (Addendum)
 Non-bleeding external and internal hemorrhoids.                           - No specimens collected. Recommendation:           - Resume previous diet.                           - Continue present medications.                           - Await pathology results.                           - Repeat colonoscopy in 10 years for surveillance.     YOU HAD AN ENDOSCOPIC PROCEDURE TODAY AT THE [facility]:   Refer to the procedure report that was given to you for any specific questions about what was found during the examination.  If the procedure report does not answer your questions, please call your gastroenterologist to clarify.  If you requested that your care partner not be given the details of your procedure findings, then the procedure report has been included in a sealed envelope for you to review at your convenience later.  YOU SHOULD EXPECT: Some feelings of bloating in the abdomen. Passage of more gas than usual.  Walking can help get rid of the air that was put into your GI tract during the procedure and reduce the bloating. If you had a lower endoscopy (such as a colonoscopy or flexible sigmoidoscopy) you may notice spotting of blood in your stool or on the toilet paper. If you underwent a bowel prep for your procedure, you may not have a normal bowel movement for a few days.  Please Note:  You might notice some irritation and congestion in your nose or some drainage.  This is from the oxygen used during your procedure.  There is no need for concern and it should clear up in a day or so.  SYMPTOMS TO REPORT IMMEDIATELY:  Following lower endoscopy (colonoscopy or flexible sigmoidoscopy):  Excessive amounts of blood in the stool  Significant tenderness or worsening of abdominal pains  Swelling of the abdomen that is new, acute  Fever of 100F or higher   For urgent or emergent issues, a gastroenterologist can be reached at any hour by calling [phone]. Do not use  MyChart messaging for urgent concerns.    DIET:  We do recommend a small meal at first, but then you may proceed to your regular diet.  Drink plenty of fluids but you should avoid alcoholic beverages for 24 hours.  ACTIVITY:  You should plan to take it easy for the rest of today and you should NOT DRIVE or use heavy machinery until tomorrow (because of the sedation medicines used during the test).    FOLLOW UP: Our staff will call the number listed on your records the next business day following your procedure.  We will call around 7:15- 8:00 am to check on you and address any questions or concerns that you may have regarding the information given to you following your procedure. If we do not reach you, we will leave a message.     If any biopsies were taken you will be contacted by phone or by letter within the next 1-3 weeks.  Please call us   at [phone] if you have not heard about the biopsies in 3 weeks.    SIGNATURES/CONFIDENTIALITY: You and/or your care partner have signed paperwork which will be entered into your electronic medical record.  These signatures attest to the fact that that the information above on your After Visit Summary has been reviewed and is understood.  Full responsibility of the confidentiality of this discharge information lies with you and/or your care-partner.

## 2024-02-21 NOTE — Op Note (Signed)
 [facility] Patient Name: [name] Procedure Date: 02/21/2024 10:26 AM MRN: [id number] Endoscopist: Gustav ALONSO Mcgee , MD, [id number] Age: 48 Referring MD:  Date of Birth: [birth year] Gender: Female Account #: [id number] Procedure:                Colonoscopy Indications:              Screening for colorectal malignant neoplasm Medicines:                Monitored Anesthesia Care Procedure:                Pre-Anesthesia Assessment:                           - Prior to the procedure, a History and Physical                            was performed, and patient medications and                            allergies were reviewed. The patient's tolerance of                            previous anesthesia was also reviewed. The risks                            and benefits of the procedure and the sedation                            options and risks were discussed with the patient.                            All questions were answered, and informed consent                            was obtained. Prior Anticoagulants: The patient has                            taken no anticoagulant or antiplatelet agents. ASA                            Grade Assessment: II - A patient with mild systemic                            disease. After reviewing the risks and benefits,                            the patient was deemed in satisfactory condition to                            undergo the procedure.                           After obtaining informed consent, the colonoscope  was passed under direct vision. Throughout the                            procedure, the patient's blood pressure, pulse, and                            oxygen saturations were monitored continuously. The                            [id number]-HQ190L Colonoscope [id number] was introduced                            through the anus and advanced to the the cecum,                            identified by  appendiceal orifice and ileocecal                            valve. The colonoscopy was performed without                            difficulty. The patient tolerated the procedure                            well. The quality of the bowel preparation was                            good. The ileocecal valve, appendiceal orifice, and                            rectum were photographed. Scope In: 10:52:10 AM Scope Out: 11:06:39 AM Scope Withdrawal Time: 0 hours 9 minutes 30 seconds  Total Procedure Duration: 0 hours 14 minutes 29 seconds  Findings:                 The perianal and digital rectal examinations were                            normal.                           Non-bleeding external and internal hemorrhoids were                            found during retroflexion. The hemorrhoids were                            small. Complications:            No immediate complications. Estimated Blood Loss:     Estimated blood loss was minimal. Impression:               - Non-bleeding external and internal hemorrhoids.                           - No specimens collected. Recommendation:           -  Resume previous diet.                           - Continue present medications.                           - Await pathology results.                           - Repeat colonoscopy in 10 years for surveillance. Kaizen Ibsen V. Lavonna Lampron, MD 02/21/2024 11:14:47 AM This report has been signed electronically.

## 2024-02-21 NOTE — Progress Notes (Signed)
 Report given to PACU, vss

## 2024-02-21 NOTE — Progress Notes (Signed)
 Pt's states no medical or surgical changes since previsit or office visit.

## 2024-02-21 NOTE — Progress Notes (Signed)
 [facility] History and Physical   Primary Care Physician:  Patient, No Pcp Per   Reason for Procedure:  Colorectal cancer screening  Plan:    Screening colonoscopy with possible interventions as needed     HPI: [name] is a very pleasant 48 y.o. female here for screening colonoscopy. Denies any nausea, vomiting, abdominal pain, melena or bright red blood per rectum  The risks and benefits as well as alternatives of endoscopic procedure(s) have been discussed and reviewed. All questions answered. The patient agrees to proceed.    Past Medical History:  Diagnosis Date   Anemia    Asthma    inhaler Rx not filled-not used in 1 year, exercise/seasonal allergy induced   Dysrhythmia    palpitations- sinus tachycardia   Fibroid    GERD (gastroesophageal reflux disease)    occasional-uses zantac  prn   Headache(784.0)    rare migraine   Syncope    last time fainted over 2 years ago   Tachycardia     Past Surgical History:  Procedure Laterality Date   BREAST ENHANCEMENT SURGERY  2007   BREAST SURGERY     CESAREAN SECTION  2012   CESAREAN SECTION N/A 10/28/2012   Procedure: CESAREAN SECTION;  Surgeon: Norleen GORMAN Skill, MD;  Location: [facility];  Service: Obstetrics;  Laterality: N/A;  REPEAT EDC 11/18/12   MYOMECTOMY  09/13/2011   Procedure: MYOMECTOMY;  Surgeon: Rosaline LITTIE Cobble, MD;  Location: [facility];  Service: Gynecology;  Laterality: N/A;  abdominal myomectomies    Prior to Admission medications   Medication Sig Start Date End Date Taking? Authorizing Provider  EPINEPHrine  (AUVI-Q ) 0.3 mg/0.3 mL IJ SOAJ injection Inject 0.3 mg into the muscle once.    [provider]    Current Outpatient Medications  Medication Sig Dispense Refill   EPINEPHrine  (AUVI-Q ) 0.3 mg/0.3 mL IJ SOAJ injection Inject 0.3 mg into the muscle once.     Current Facility-Administered Medications  Medication Dose Route Frequency Provider Last Rate Last Admin   0.9 %  sodium  chloride infusion  500 mL Intravenous Once Kember Boch V, MD        Allergies as of 02/21/2024 - Review Complete 02/21/2024  Allergen Reaction Noted   Bactrim Rash 08/27/2011   Cephalexin Rash 08/27/2011   Penicillins Rash 08/27/2011   Zithromax [azithromycin] Rash 08/27/2011    Family History  Problem Relation Age of Onset   Asthma Mother    Kidney cancer Father    Heart disease Maternal Grandmother    Stroke Maternal Grandmother    Cancer Maternal Grandfather    Colon cancer Neg Hx    Rectal cancer Neg Hx    Stomach cancer Neg Hx     Social History   Socioeconomic History   Marital status: Married    Spouse name: Not on file   Number of children: Not on file   Years of education: Not on file   Highest education level: Not on file  Occupational History   Not on file  Tobacco Use   Smoking status: Never   Smokeless tobacco: Never  Vaping Use   Vaping status: Never Used  Substance and Sexual Activity   Alcohol use: No    Alcohol/week: 0.0 standard drinks of alcohol    Comment: rare- none while pregnant   Drug use: No   Sexual activity: Not Currently    Birth control/protection: None  Other Topics Concern   Not on file  Social History Narrative  Not on file   Social Drivers of Health   Financial Resource Strain: Not on file  Food Insecurity: Not on file  Transportation Needs: Not on file  Physical Activity: Not on file  Stress: Not on file  Social Connections: Not on file  Intimate Partner Violence: Not on file    Review of Systems:  All other review of systems negative except as mentioned in the HPI.  Physical Exam: Vital signs in last 24 hours: BP (!) 127/94   Pulse 78   Temp (!) 97.3 F (36.3 C) (Temporal)   Ht 5' 4.5 (1.638 m)   Wt 135 lb 6.4 oz (61.4 kg)   LMP 09/06/2011   SpO2 100%   Breastfeeding No   BMI 22.88 kg/m  General:   Alert, NAD Lungs:  Clear .   Heart:  Regular rate and rhythm Abdomen:  Soft, nontender and  nondistended. Neuro/Psych:  Alert and cooperative. Normal mood and affect. A and O x 3  Reviewed labs, radiology imaging, old records and pertinent past GI work up  Patient is appropriate for planned procedure(s) and anesthesia in an ambulatory setting   K. Veena Monnica Saltsman , MD [phone]

## 2024-02-25 ENCOUNTER — Telehealth: Payer: Self-pay | Admitting: *Deleted

## 2024-02-25 NOTE — Telephone Encounter (Signed)
  Follow up Call-     02/21/2024   10:02 AM  Call back number  Post procedure Call Back phone  # [phone]  Permission to leave phone message Yes     Patient questions:  Do you have a fever, pain , or abdominal swelling? No. Pain Score  0 *  Have you tolerated food without any problems? Yes.    Have you been able to return to your normal activities? Yes.    Do you have any questions about your discharge instructions: Diet   No. Medications  No. Follow up visit  No.  Do you have questions or concerns about your Care? No.  Actions: * If pain score is 4 or above: No action needed, pain <4.
